# Patient Record
Sex: Female | Born: 1995 | Race: White | Hispanic: No | Marital: Single | Smoking: Current every day smoker
Health system: Southern US, Community
[De-identification: ages and names within clinical notes are randomized; demographics above are authoritative.]

## PROBLEM LIST (undated history)

## (undated) ENCOUNTER — Inpatient Hospital Stay (HOSPITAL_COMMUNITY): Payer: Self-pay

## (undated) DIAGNOSIS — M51369 Other intervertebral disc degeneration, lumbar region without mention of lumbar back pain or lower extremity pain: Secondary | ICD-10-CM

## (undated) DIAGNOSIS — M5136 Other intervertebral disc degeneration, lumbar region: Secondary | ICD-10-CM

## (undated) DIAGNOSIS — Z789 Other specified health status: Secondary | ICD-10-CM

## (undated) HISTORY — PX: HEMANGIOMA EXCISION: SHX1734

## (undated) HISTORY — PX: WISDOM TOOTH EXTRACTION: SHX21

---
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
## 2013-04-06 ENCOUNTER — Encounter (HOSPITAL_BASED_OUTPATIENT_CLINIC_OR_DEPARTMENT_OTHER): Payer: Self-pay | Admitting: Emergency Medicine

## 2013-04-06 ENCOUNTER — Emergency Department (HOSPITAL_BASED_OUTPATIENT_CLINIC_OR_DEPARTMENT_OTHER)
Admission: EM | Admit: 2013-04-06 | Discharge: 2013-04-06 | Disposition: A | Discharge status: Home / Self Care | Payer: Medicaid Other | Attending: Emergency Medicine | Admitting: Emergency Medicine

## 2013-04-06 DIAGNOSIS — F17200 Nicotine dependence, unspecified, uncomplicated: Secondary | ICD-10-CM | POA: Insufficient documentation

## 2013-04-06 DIAGNOSIS — B86 Scabies: Secondary | ICD-10-CM

## 2013-04-06 MED ORDER — PERMETHRIN 5 % EX CREA: TOPICAL_CREAM | CUTANEOUS | Status: DC

## 2013-04-06 NOTE — ED Notes (Signed)
Pt reports scabies on arms, legs and abdomen.

## 2013-04-06 NOTE — ED Provider Notes (Signed)
Medical screening examination/treatment/procedure(s) were performed by non-physician practitioner and as supervising physician I was immediately available for consultation/collaboration.   Charles B. Sheldon, MD 04/06/13 2230 

## 2013-04-06 NOTE — ED Provider Notes (Signed)
CSN: [id number]     Arrival date & time 04/06/13  1901 History   First MD Initiated Contact with Patient 04/06/13 1913     Chief Complaint  Patient presents with  . Recurrent Skin Infections   (Consider location/radiation/quality/duration/timing/severity/associated sxs/prior Treatment) HPI Comments: Patient is a 17 year old female who presents with a 3 day history of rash. The rash started gradually and progressively worsened since the onset. The rash is located on her arms, legs and abdomen. Patient has tried calamine lotion without relief. Patient denies new exposures to medications, soaps, lotions, detergent. Patient reports associated itching. Patient's son was diagnosed with scabies after another child in daycare exposed him to it. No aggravating/alleviating factors. Patient denies fever, chills, NVD, sore throat, oral lesions, ocular involvement, throat closing, wheezing, SOB, chest pain, abdominal pain.      Past Medical History  Diagnosis Date  . No pertinent past medical history   . Normal pregnancy 07/04/2011  . SVD (spontaneous vaginal delivery) 07/05/2011   Past Surgical History  Procedure Laterality Date  . Wisdom tooth extraction    . Hemangioma excision     History reviewed. No pertinent family history. History  Substance Use Topics  . Smoking status: Current Every Day Smoker -- 0.50 packs/day    Types: Cigarettes  . Smokeless tobacco: Not on file  . Alcohol Use: Yes   OB History   Grav Para Term Preterm Abortions TAB SAB Ect Mult Living   1 1 1       1      Review of Systems  Skin: Positive for rash.  All other systems reviewed and are negative.    Allergies  Review of patient's allergies indicates no known allergies.  Home Medications  No current outpatient prescriptions on file. BP 105/59  Pulse 73  Temp(Src) 98.4 F (36.9 C) (Oral)  Resp 18  Ht 5\' 4"  (1.626 m)  Wt 120 lb (54.432 kg)  BMI 20.59 kg/m2  SpO2 99% Physical Exam  Nursing note and  vitals reviewed. Constitutional: She appears well-developed and well-nourished. No distress.  HENT:  Head: Normocephalic and atraumatic.  Eyes: Conjunctivae are normal.  Neck: Normal range of motion.  Cardiovascular: Normal rate and regular rhythm.  Exam reveals no gallop and no friction rub.   No murmur heard. Pulmonary/Chest: Effort normal and breath sounds normal. She has no wheezes. She has no rales. She exhibits no tenderness.  Abdominal: Soft. There is no tenderness.  Musculoskeletal: Normal range of motion.  Neurological: She is alert.  Speech is goal-oriented. Moves limbs without ataxia.   Skin: Skin is warm and dry.  Small scabbed papules located on bilateral arms, legs and abdomen with overlying excoriations.   Psychiatric: She has a normal mood and affect. Her behavior is normal.    ED Course  Procedures (including critical care time) Labs Review Labs Reviewed - No data to display Imaging Review No results found.  EKG Interpretation   None       MDM   1. Scabies     7:16 PM Patient has scabies. I will treat her with Permethrin cream. No further evaluation needed at this time. Vitals stable and patient afebrile.    Emilia Beck, PA-C 04/06/13 1921

## 2013-11-20 ENCOUNTER — Emergency Department (HOSPITAL_BASED_OUTPATIENT_CLINIC_OR_DEPARTMENT_OTHER)
Admission: EM | Admit: 2013-11-20 | Discharge: 2013-11-20 | Disposition: A | Discharge status: Home / Self Care | Payer: Medicaid Other | Attending: Emergency Medicine | Admitting: Emergency Medicine

## 2013-11-20 ENCOUNTER — Encounter (HOSPITAL_BASED_OUTPATIENT_CLINIC_OR_DEPARTMENT_OTHER): Payer: Self-pay | Admitting: Emergency Medicine

## 2013-11-20 DIAGNOSIS — Z79899 Other long term (current) drug therapy: Secondary | ICD-10-CM | POA: Insufficient documentation

## 2013-11-20 DIAGNOSIS — Z3202 Encounter for pregnancy test, result negative: Secondary | ICD-10-CM | POA: Insufficient documentation

## 2013-11-20 DIAGNOSIS — F17200 Nicotine dependence, unspecified, uncomplicated: Secondary | ICD-10-CM | POA: Insufficient documentation

## 2013-11-20 DIAGNOSIS — N390 Urinary tract infection, site not specified: Secondary | ICD-10-CM

## 2013-11-20 LAB — URINALYSIS, ROUTINE W REFLEX MICROSCOPIC
Glucose, UA: NEGATIVE mg/dL
KETONES UR: 15 mg/dL — AB
NITRITE: NEGATIVE
PH: 5.5 (ref 5.0–8.0)
Protein, ur: 100 mg/dL — AB
SPECIFIC GRAVITY, URINE: 1.023 (ref 1.005–1.030)
UROBILINOGEN UA: 0.2 mg/dL (ref 0.0–1.0)

## 2013-11-20 LAB — CBC WITH DIFFERENTIAL/PLATELET
Basophils Absolute: 0 10*3/uL (ref 0.0–0.1)
Basophils Relative: 0 % (ref 0–1)
EOS ABS: 0.2 10*3/uL (ref 0.0–0.7)
EOS PCT: 2 % (ref 0–5)
HEMATOCRIT: 40.6 % (ref 36.0–46.0)
HEMOGLOBIN: 14.5 g/dL (ref 12.0–15.0)
Lymphocytes Relative: 16 % (ref 12–46)
Lymphs Abs: 1.8 10*3/uL (ref 0.7–4.0)
MCH: 32.3 pg (ref 26.0–34.0)
MCHC: 35.7 g/dL (ref 30.0–36.0)
MCV: 90.4 fL (ref 78.0–100.0)
MONO ABS: 0.9 10*3/uL (ref 0.1–1.0)
MONOS PCT: 8 % (ref 3–12)
Neutro Abs: 8.4 10*3/uL — ABNORMAL HIGH (ref 1.7–7.7)
Neutrophils Relative %: 74 % (ref 43–77)
Platelets: 266 10*3/uL (ref 150–400)
RBC: 4.49 MIL/uL (ref 3.87–5.11)
RDW: 12.5 % (ref 11.5–15.5)
WBC: 11.2 10*3/uL — ABNORMAL HIGH (ref 4.0–10.5)

## 2013-11-20 LAB — BASIC METABOLIC PANEL
BUN: 10 mg/dL (ref 6–23)
CO2: 24 mEq/L (ref 19–32)
Calcium: 9.6 mg/dL (ref 8.4–10.5)
Chloride: 103 mEq/L (ref 96–112)
Creatinine, Ser: 0.7 mg/dL (ref 0.50–1.10)
GFR calc Af Amer: >90 mL/min (ref >90)
GLUCOSE: 101 mg/dL — AB (ref 70–99)
Potassium: 3.8 mEq/L (ref 3.7–5.3)
Sodium: 140 mEq/L (ref 137–147)

## 2013-11-20 LAB — URINE MICROSCOPIC-ADD ON

## 2013-11-20 LAB — PREGNANCY, URINE: Preg Test, Ur: NEGATIVE

## 2013-11-20 MED ORDER — ONDANSETRON HCL 4 MG/2ML IJ SOLN
4.0000 mg | Freq: Once | INTRAMUSCULAR | Status: AC
  Administered 2013-11-20: 4 mg via INTRAVENOUS
  Filled 2013-11-20: qty 2

## 2013-11-20 MED ORDER — SODIUM CHLORIDE 0.9 % IV BOLUS (SEPSIS)
1000.0000 mL | Freq: Once | INTRAVENOUS | Status: AC
  Administered 2013-11-20: 1000 mL via INTRAVENOUS

## 2013-11-20 MED ORDER — CEPHALEXIN 500 MG PO CAPS
500.0000 mg | ORAL_CAPSULE | Freq: Two times a day (BID) | ORAL | Status: DC
Start: 2013-11-20 — End: 2014-03-17

## 2013-11-20 MED ORDER — KETOROLAC TROMETHAMINE 30 MG/ML IJ SOLN
30.0000 mg | Freq: Once | INTRAMUSCULAR | Status: AC
  Administered 2013-11-20: 30 mg via INTRAVENOUS
  Filled 2013-11-20: qty 1

## 2013-11-20 MED ORDER — KETOROLAC TROMETHAMINE 10 MG PO TABS: 10.0000 mg | ORAL_TABLET | Freq: Four times a day (QID) | ORAL | Status: DC | PRN

## 2013-11-20 MED ORDER — CEFTRIAXONE SODIUM 1 G IJ SOLR
INTRAMUSCULAR | Status: AC
  Filled 2013-11-20: qty 10

## 2013-11-20 MED ORDER — DEXTROSE 5 % IV SOLN
1.0000 g | Freq: Once | INTRAVENOUS | Status: AC
  Administered 2013-11-20: 1 g via INTRAVENOUS

## 2013-11-20 NOTE — Discharge Instructions (Signed)
Urinary Tract Infection  Urinary tract infections (UTIs) can develop anywhere along your urinary tract. Your urinary tract is your body's drainage system for removing wastes and extra water. Your urinary tract includes two kidneys, two ureters, a bladder, and a urethra. Your kidneys are a pair of bean-shaped organs. Each kidney is about the size of your fist. They are located below your ribs, one on each side of your spine.  CAUSES  Infections are caused by microbes, which are microscopic organisms, including fungi, viruses, and bacteria. These organisms are so small that they can only be seen through a microscope. Bacteria are the microbes that most commonly cause UTIs.  SYMPTOMS   Symptoms of UTIs may vary by age and gender of the patient and by the location of the infection. Symptoms in young women typically include a frequent and intense urge to urinate and a painful, burning feeling in the bladder or urethra during urination. Older women and men are more likely to be tired, shaky, and weak and have muscle aches and abdominal pain. A fever may mean the infection is in your kidneys. Other symptoms of a kidney infection include pain in your back or sides below the ribs, nausea, and vomiting.  DIAGNOSIS  To diagnose a UTI, your caregiver will ask you about your symptoms. Your caregiver also will ask to provide a urine sample. The urine sample will be tested for bacteria and white blood cells. White blood cells are made by your body to help fight infection.  TREATMENT   Typically, UTIs can be treated with medication. Because most UTIs are caused by a bacterial infection, they usually can be treated with the use of antibiotics. The choice of antibiotic and length of treatment depend on your symptoms and the type of bacteria causing your infection.  HOME CARE INSTRUCTIONS   If you were prescribed antibiotics, take them exactly as your caregiver instructs you. Finish the medication even if you feel better after you  have only taken some of the medication.   Drink enough water and fluids to keep your urine clear or pale yellow.   Avoid caffeine, tea, and carbonated beverages. They tend to irritate your bladder.   Empty your bladder often. Avoid holding urine for long periods of time.   Empty your bladder before and after sexual intercourse.   After a bowel movement, women should cleanse from front to back. Use each tissue only once.  SEEK MEDICAL CARE IF:    You have back pain.   You develop a fever.   Your symptoms do not begin to resolve within 3 days.  SEEK IMMEDIATE MEDICAL CARE IF:    You have severe back pain or lower abdominal pain.   You develop chills.   You have nausea or vomiting.   You have continued burning or discomfort with urination.  MAKE SURE YOU:    Understand these instructions.   Will watch your condition.   Will get help right away if you are not doing well or get worse.  Document Released: 03/19/2005 Document Revised: 12/09/2011 Document Reviewed: 07/18/2011  [employer] Patient Information 2014 [employer].

## 2013-11-20 NOTE — ED Notes (Signed)
Blood in urine and "everything hurts"

## 2013-11-20 NOTE — ED Provider Notes (Signed)
TIME SEEN: 11:14 AM  CHIEF COMPLAINT: Dysuria, hematuria, body aches, suprapubic pain  HPI: Patient is a 18 year old female who presents to the emergency department with dysuria that started last night and hematuria. She has had body aches, nausea,  suprapubic abdominal pain. She denies any vaginal bleeding or discharge. No fevers or chills. No vomiting or diarrhea. No vaginal discharge. Her last menstrual period was 2 weeks ago. She states this feels like urinary tract infections and she has had the past but is more severe.  ROS: See HPI Constitutional: no fever  Eyes: no drainage  ENT: no runny nose   Cardiovascular:  no chest pain  Resp: no SOB  GI: no vomiting GU: dysuria Integumentary: no rash  Allergy: no hives  Musculoskeletal: no leg swelling  Neurological: no slurred speech ROS otherwise negative  PAST MEDICAL HISTORY/PAST SURGICAL HISTORY:  Past Medical History  Diagnosis Date  . No pertinent past medical history   . Normal pregnancy 07/04/2011  . SVD (spontaneous vaginal delivery) 07/05/2011    MEDICATIONS:  Prior to Admission medications   Medication Sig Start Date End Date Taking? Authorizing Provider  permethrin (ELIMITE) 5 % cream Apply to affected area once 04/06/13   Emilia Beck, PA-C    ALLERGIES:  No Known Allergies  SOCIAL HISTORY:  History  Substance Use Topics  . Smoking status: Current Every Day Smoker -- 0.50 packs/day    Types: Cigarettes  . Smokeless tobacco: Not on file  . Alcohol Use: No    FAMILY HISTORY: No family history on file.  EXAM: BP 128/77  Pulse 91  Temp(Src) 98.1 F (36.7 C) (Oral)  Resp 18  Ht 5\' 4"  (1.626 m)  Wt 115 lb (52.164 kg)  BMI 19.73 kg/m2  SpO2 100% CONSTITUTIONAL: Alert and oriented and responds appropriately to questions. Well-appearing; well-nourished, nontoxic but does appear uncomfortable HEAD: Normocephalic EYES: Conjunctivae clear, PERRL ENT: normal nose; no rhinorrhea; moist mucous membranes;  pharynx without lesions noted NECK: Supple, no meningismus, no LAD  CARD: RRR; S1 and S2 appreciated; no murmurs, no clicks, no rubs, no gallops RESP: Normal chest excursion without splinting or tachypnea; breath sounds clear and equal bilaterally; no wheezes, no rhonchi, no rales,  ABD/GI: Normal bowel sounds; non-distended; soft, mild suprapubic tenderness on exam, no rebound or guarding, no peritoneal signs BACK:  The back appears normal and is non-tender to palpation, there is no CVA tenderness EXT: Normal ROM in all joints; non-tender to palpation; no edema; normal capillary refill; no cyanosis    SKIN: Normal color for age and race; warm NEURO: Moves all extremities equally PSYCH: The patient's mood and manner are appropriate. Grooming and personal hygiene are appropriate.  MEDICAL DECISION MAKING: Patient here with likely UTI. Will obtain labs, urine, urine pregnancy test. We'll give IV fluids, Zofran and Toradol. I do not feel she needs imaging at this time given her abdominal exam is relatively benign.  ED PROGRESS: Patient does have urinary tract infection. Culture is pending. Urine pregnancy negative. Fiber is unremarkable. She states she's feeling much better after IV fluids, Zofran and Toradol. We'll give IV ceftriaxone. We'll discharge with prescription for Keflex and oral Toradol. Discussed strict return precautions and supportive care instructions. She verbalizes understanding is comfortable plan. She is pleased with care.     Layla Maw Ward, DO 11/20/13 [phone]

## 2013-11-22 LAB — URINE CULTURE: Colony Count: >=100000

## 2013-11-24 ENCOUNTER — Telehealth (HOSPITAL_BASED_OUTPATIENT_CLINIC_OR_DEPARTMENT_OTHER): Payer: Self-pay | Admitting: Emergency Medicine

## 2013-11-24 NOTE — Telephone Encounter (Signed)
Post ED Visit - Positive Culture Follow-up  Culture report reviewed by antimicrobial stewardship pharmacist: []  Marlou Sa, Pharm.D., BCPS []  Celedonio Miyamoto, Pharm.D., BCPS []  Georgina Pillion, Pharm.D., BCPS [x]  [place], [place].D., BCPS, AAHIVP []  Estella Husk, Pharm.D., BCPS, AAHIVP []  Harvie Junior, Pharm.D.  Positive urine culture Treated with Keflex, organism sensitive to the same and no further patient follow-up is required at this time.  Xavius Spadafore 11/24/2013, 1:41 PM

## 2013-12-02 ENCOUNTER — Encounter (HOSPITAL_BASED_OUTPATIENT_CLINIC_OR_DEPARTMENT_OTHER): Payer: Self-pay | Admitting: Emergency Medicine

## 2013-12-02 DIAGNOSIS — Z792 Long term (current) use of antibiotics: Secondary | ICD-10-CM | POA: Insufficient documentation

## 2013-12-02 DIAGNOSIS — J029 Acute pharyngitis, unspecified: Secondary | ICD-10-CM | POA: Insufficient documentation

## 2013-12-02 DIAGNOSIS — F17200 Nicotine dependence, unspecified, uncomplicated: Secondary | ICD-10-CM | POA: Insufficient documentation

## 2013-12-02 LAB — RAPID STREP SCREEN (MED CTR MEBANE ONLY): Streptococcus, Group A Screen (Direct): NEGATIVE

## 2013-12-02 NOTE — ED Notes (Signed)
Pt c/o sore throat x2 days 

## 2013-12-03 ENCOUNTER — Emergency Department (HOSPITAL_BASED_OUTPATIENT_CLINIC_OR_DEPARTMENT_OTHER)
Admission: EM | Admit: 2013-12-03 | Discharge: 2013-12-03 | Disposition: A | Discharge status: Home / Self Care | Payer: Medicaid Other | Attending: Emergency Medicine | Admitting: Emergency Medicine

## 2013-12-03 DIAGNOSIS — J029 Acute pharyngitis, unspecified: Secondary | ICD-10-CM

## 2013-12-03 LAB — MONONUCLEOSIS SCREEN: MONO SCREEN: NEGATIVE

## 2013-12-03 MED ORDER — AMOXICILLIN 500 MG PO CAPS: 500.0000 mg | ORAL_CAPSULE | Freq: Three times a day (TID) | ORAL | Status: DC

## 2013-12-03 NOTE — ED Provider Notes (Signed)
CSN: [id number]     Arrival date & time 12/02/13  2135 History   First MD Initiated Contact with Patient 12/03/13 0023     Chief Complaint  Patient presents with  . Sore Throat     (Consider location/radiation/quality/duration/timing/severity/associated sxs/prior Treatment) Patient is a 18 y.o. female presenting with pharyngitis. The history is provided by the patient.  Sore Throat This is a new problem. The current episode started more than 2 days ago. The problem occurs constantly. The problem has not changed since onset.Pertinent negatives include no chest pain, no abdominal pain, no headaches and no shortness of breath. Nothing aggravates the symptoms. Nothing relieves the symptoms. She has tried nothing for the symptoms. The treatment provided no relief.    Past Medical History  Diagnosis Date  . No pertinent past medical history   . Normal pregnancy 07/04/2011  . SVD (spontaneous vaginal delivery) 07/05/2011   Past Surgical History  Procedure Laterality Date  . Wisdom tooth extraction    . Hemangioma excision     History reviewed. No pertinent family history. History  Substance Use Topics  . Smoking status: Current Every Day Smoker -- 0.50 packs/day    Types: Cigarettes  . Smokeless tobacco: Not on file  . Alcohol Use: No   OB History   Grav Para Term Preterm Abortions TAB SAB Ect Mult Living   1 1 1       1      Review of Systems  Constitutional: Negative for fever.  Respiratory: Negative for shortness of breath.   Cardiovascular: Negative for chest pain.  Gastrointestinal: Negative for abdominal pain.  Neurological: Negative for headaches.  All other systems reviewed and are negative.     Allergies  Review of patient's allergies indicates no known allergies.  Home Medications   Prior to Admission medications   Medication Sig Start Date End Date Taking? Authorizing Provider  cephALEXin (KEFLEX) 500 MG capsule Take 1 capsule (500 mg total) by mouth 2 (two)  times daily. 11/20/13   Kristen N Ward, DO  ketorolac (TORADOL) 10 MG tablet Take 1 tablet (10 mg total) by mouth every 6 (six) hours as needed for moderate pain. 11/20/13   Kristen N Ward, DO  permethrin (ELIMITE) 5 % cream Apply to affected area once 04/06/13   Kaitlyn Szekalski, PA-C   BP 105/72  Pulse 99  Temp(Src) 99.3 F (37.4 C) (Oral)  Resp 16  Ht 5' (1.524 m)  Wt 115 lb (52.164 kg)  BMI 22.46 kg/m2  SpO2 100% Physical Exam  Constitutional: She is oriented to person, place, and time. She appears well-developed and well-nourished. No distress.  HENT:  Head: Normocephalic and atraumatic.  Mouth/Throat: Oropharynx is clear and moist.  White plaque on left tonsil  Eyes: Conjunctivae are normal. Pupils are equal, round, and reactive to light.  Neck: Normal range of motion. Neck supple. No tracheal deviation present.  No pain with displacement of the trachea  Cardiovascular: Normal rate, regular rhythm and intact distal pulses.   Pulmonary/Chest: Effort normal and breath sounds normal. She has no wheezes. She has no rales.  Abdominal: Soft. Bowel sounds are normal. There is no tenderness. There is no rebound and no guarding.  Musculoskeletal: Normal range of motion.  Lymphadenopathy:    She has no cervical adenopathy.  Neurological: She is alert and oriented to person, place, and time.  Skin: Skin is warm and dry.  Psychiatric: She has a normal mood and affect.    ED Course  Procedures (  including critical care time) Labs Review Labs Reviewed  RAPID STREP SCREEN  CULTURE, GROUP A STREP  MONONUCLEOSIS SCREEN    Imaging Review No results found.   EKG Interpretation None      MDM   Final diagnoses:  None    Exam suspicion for strep will treat    Angela Kessenich K Cesare Sumlin-Rasch, MD 12/03/13 0117

## 2013-12-03 NOTE — ED Notes (Signed)
Came to room to discharge pt and she was angry and crying.  When I told her about her rx for Amoxicillin she stated that she had just gotten off Amoxicillin for a bladder infection.  York SpanielSaid she has "Sat here for 5 hours for nothing.  I would be better off at home in my bed.  That ain't gonna do nothing for me."  Pt grabbed the DC papers and rx and walked out of the room.  Could not get DC signature.

## 2013-12-04 ENCOUNTER — Emergency Department (HOSPITAL_COMMUNITY)
Admission: EM | Admit: 2013-12-04 | Discharge: 2013-12-04 | Disposition: A | Discharge status: Home / Self Care | Payer: Medicaid Other | Attending: Emergency Medicine | Admitting: Emergency Medicine

## 2013-12-04 ENCOUNTER — Encounter (HOSPITAL_COMMUNITY): Payer: Self-pay | Admitting: Emergency Medicine

## 2013-12-04 DIAGNOSIS — Z792 Long term (current) use of antibiotics: Secondary | ICD-10-CM | POA: Insufficient documentation

## 2013-12-04 DIAGNOSIS — F17200 Nicotine dependence, unspecified, uncomplicated: Secondary | ICD-10-CM | POA: Insufficient documentation

## 2013-12-04 DIAGNOSIS — J029 Acute pharyngitis, unspecified: Secondary | ICD-10-CM | POA: Insufficient documentation

## 2013-12-04 LAB — CULTURE, GROUP A STREP

## 2013-12-04 MED ORDER — DEXAMETHASONE SODIUM PHOSPHATE 10 MG/ML IJ SOLN
10.0000 mg | Freq: Once | INTRAMUSCULAR | Status: AC
  Administered 2013-12-04: 10 mg via INTRAMUSCULAR
  Filled 2013-12-04: qty 1

## 2013-12-04 MED ORDER — HYDROCODONE-ACETAMINOPHEN 5-325 MG PO TABS: 1.0000 | ORAL_TABLET | ORAL | Status: DC | PRN

## 2013-12-04 NOTE — ED Provider Notes (Signed)
CSN: [id number]     Arrival date & time 12/04/13  1300 History  This chart was scribed for non-physician practitioner, Teressa LowerVrinda Tayte Childers, NP working with Lyanne CoKevin M Campos, MD by Greggory StallionKayla Andersen, ED scribe. This patient was seen in room WTR8/WTR8 and the patient's care was started at 1:47 PM.    Chief Complaint  Patient presents with  . Sore Throat   The history is provided by the patient. No language interpreter was used.   HPI Comments: Angela Orozco is a 18 y.o. female who presents to the Emergency Department complaining of gradual onset sore throat that started 3 days ago. [facility] where strep and mono tests were both negative. She was discharged home with amoxicillin and has been taking it as prescribed. Swallowing worsens the pain. Pt also reports congestion and intermittent fever. Denies cough.   Past Medical History  Diagnosis Date  . No pertinent past medical history   . Normal pregnancy 07/04/2011  . SVD (spontaneous vaginal delivery) 07/05/2011   Past Surgical History  Procedure Laterality Date  . Wisdom tooth extraction    . Hemangioma excision     History reviewed. No pertinent family history. History  Substance Use Topics  . Smoking status: Current Every Day Smoker -- 0.50 packs/day    Types: Cigarettes  . Smokeless tobacco: Not on file  . Alcohol Use: No   OB History   Grav Para Term Preterm Abortions TAB SAB Ect Mult Living   1 1 1       1      Review of Systems  Constitutional: Positive for fever.  HENT: Positive for congestion and sore throat.   Respiratory: Negative for cough.   All other systems reviewed and are negative.  Allergies  Review of patient's allergies indicates no known allergies.  Home Medications   Prior to Admission medications   Medication Sig Start Date End Date Taking? Authorizing Provider  amoxicillin (AMOXIL) 500 MG capsule Take 1 capsule (500 mg total) by mouth 3 (three) times daily.  12/03/13   April K Palumbo-Rasch, MD  cephALEXin (KEFLEX) 500 MG capsule Take 1 capsule (500 mg total) by mouth 2 (two) times daily. 11/20/13   Kristen N Ward, DO  ketorolac (TORADOL) 10 MG tablet Take 1 tablet (10 mg total) by mouth every 6 (six) hours as needed for moderate pain. 11/20/13   Kristen N Ward, DO  permethrin (ELIMITE) 5 % cream Apply to affected area once 04/06/13   Kaitlyn Szekalski, PA-C   BP 114/69  Pulse 103  Temp(Src) 98.7 F (37.1 C) (Oral)  Resp 16  SpO2 97%  Physical Exam  Nursing note and vitals reviewed. Constitutional: She is oriented to person, place, and time. She appears well-developed and well-nourished. No distress.  HENT:  Head: Normocephalic and atraumatic.  Ulcer on right tonsils. No sign of peritonsillar abscess.   Eyes: Conjunctivae and EOM are normal.  Neck: Normal range of motion. No tracheal deviation present.  Cardiovascular: Normal rate, regular rhythm and normal heart sounds.   Pulmonary/Chest: Effort normal and breath sounds normal. No respiratory distress. She has no wheezes. She has no rales.  Musculoskeletal: Normal range of motion.  Neurological: She is alert and oriented to person, place, and time.  Skin: Skin is warm and dry.  Psychiatric: She has a normal mood and affect. Her behavior is normal.    ED Course  Procedures (including critical care time)  DIAGNOSTIC STUDIES: Oxygen Saturation  is 97% on RA, normal by my interpretation.    COORDINATION OF CARE: 1:49 PM-Discussed treatment plan with pt at bedside and pt agreed to plan.   Labs Review Labs Reviewed - No data to display  Imaging Review No results found.   EKG Interpretation None      MDM   Final diagnoses:  Pharyngitis    Culture not back. Pt on amoxicillin. Pt treated with decadron and pain medication. No sign of pta  I personally performed the services described in this documentation, which was scribed in my presence. The recorded information has been  reviewed and is accurate.  Teressa LowerVrinda Va Broadwell, NP 12/04/13 1405

## 2013-12-04 NOTE — ED Provider Notes (Signed)
Medical screening examination/treatment/procedure(s) were performed by non-physician practitioner and as supervising physician I was immediately available for consultation/collaboration.   EKG Interpretation None        Ranvir Renovato M Finnlee Guarnieri, MD 12/04/13 1510 

## 2013-12-04 NOTE — Discharge Instructions (Signed)
Pharyngitis °Pharyngitis is redness, pain, and swelling (inflammation) of your pharynx.  °CAUSES  °Pharyngitis is usually caused by infection. Most of the time, these infections are from viruses (viral) and are part of a cold. However, sometimes pharyngitis is caused by bacteria (bacterial). Pharyngitis can also be caused by allergies. Viral pharyngitis may be spread from person to person by coughing, sneezing, and personal items or utensils (cups, forks, spoons, toothbrushes). Bacterial pharyngitis may be spread from person to person by more intimate contact, such as kissing.  °SIGNS AND SYMPTOMS  °Symptoms of pharyngitis include:   °· Sore throat.   °· Tiredness (fatigue).   °· Low-grade fever.   °· Headache. °· Joint pain and muscle aches. °· Skin rashes. °· Swollen lymph nodes. °· Plaque-like film on throat or tonsils (often seen with bacterial pharyngitis). °DIAGNOSIS  °Your health care provider will ask you questions about your illness and your symptoms. Your medical history, along with a physical exam, is often all that is needed to diagnose pharyngitis. Sometimes, a rapid strep test is done. Other lab tests may also be done, depending on the suspected cause.  °TREATMENT  °Viral pharyngitis will usually get better in 3 4 days without the use of medicine. Bacterial pharyngitis is treated with medicines that kill germs (antibiotics).  °HOME CARE INSTRUCTIONS  °· Drink enough water and fluids to keep your urine clear or pale yellow.   °· Only take over-the-counter or prescription medicines as directed by your health care provider:   °· If you are prescribed antibiotics, make sure you finish them even if you start to feel better.   °· Do not take aspirin.   °· Get lots of rest.   °· Gargle with 8 oz of salt water (½ tsp of salt per 1 qt of water) as often as every 1 2 hours to soothe your throat.   °· Throat lozenges (if you are not at risk for choking) or sprays may be used to soothe your throat. °SEEK MEDICAL  CARE IF:  °· You have large, tender lumps in your neck. °· You have a rash. °· You cough up green, yellow-brown, or bloody spit. °SEEK IMMEDIATE MEDICAL CARE IF:  °· Your neck becomes stiff. °· You drool or are unable to swallow liquids. °· You vomit or are unable to keep medicines or liquids down. °· You have severe pain that does not go away with the use of recommended medicines. °· You have trouble breathing (not caused by a stuffy nose). °MAKE SURE YOU:  °· Understand these instructions. °· Will watch your condition. °· Will get help right away if you are not doing well or get worse. °Document Released: 06/09/2005 Document Revised: 03/30/2013 Document Reviewed: 02/14/2013 °[employer]® Patient Information ©2014 [employer]. ° °

## 2013-12-04 NOTE — ED Notes (Signed)
[facility] for same complaint. Sore throat pain 8/10. Pain when swallowing. Pt was tested and came back negative for strep and mono. Prescribe amoxicillin and has been taking as prescribed.

## 2014-01-13 ENCOUNTER — Emergency Department (HOSPITAL_BASED_OUTPATIENT_CLINIC_OR_DEPARTMENT_OTHER)
Admission: EM | Admit: 2014-01-13 | Discharge: 2014-01-13 | Disposition: A | Discharge status: Home / Self Care | Payer: Medicaid Other | Attending: Emergency Medicine | Admitting: Emergency Medicine

## 2014-01-13 ENCOUNTER — Encounter (HOSPITAL_BASED_OUTPATIENT_CLINIC_OR_DEPARTMENT_OTHER): Payer: Self-pay | Admitting: Emergency Medicine

## 2014-01-13 DIAGNOSIS — Z792 Long term (current) use of antibiotics: Secondary | ICD-10-CM | POA: Insufficient documentation

## 2014-01-13 DIAGNOSIS — M791 Myalgia, unspecified site: Secondary | ICD-10-CM

## 2014-01-13 DIAGNOSIS — T148 Other injury of unspecified body region: Secondary | ICD-10-CM | POA: Insufficient documentation

## 2014-01-13 DIAGNOSIS — IMO0001 Reserved for inherently not codable concepts without codable children: Secondary | ICD-10-CM | POA: Insufficient documentation

## 2014-01-13 DIAGNOSIS — R21 Rash and other nonspecific skin eruption: Secondary | ICD-10-CM | POA: Insufficient documentation

## 2014-01-13 DIAGNOSIS — IMO0002 Reserved for concepts with insufficient information to code with codable children: Secondary | ICD-10-CM | POA: Insufficient documentation

## 2014-01-13 DIAGNOSIS — W57XXXA Bitten or stung by nonvenomous insect and other nonvenomous arthropods, initial encounter: Secondary | ICD-10-CM | POA: Insufficient documentation

## 2014-01-13 DIAGNOSIS — Y929 Unspecified place or not applicable: Secondary | ICD-10-CM | POA: Insufficient documentation

## 2014-01-13 DIAGNOSIS — F17200 Nicotine dependence, unspecified, uncomplicated: Secondary | ICD-10-CM | POA: Insufficient documentation

## 2014-01-13 DIAGNOSIS — Y9389 Activity, other specified: Secondary | ICD-10-CM | POA: Insufficient documentation

## 2014-01-13 LAB — CBC WITH DIFFERENTIAL/PLATELET
BASOS PCT: 0 % (ref 0–1)
Basophils Absolute: 0 10*3/uL (ref 0.0–0.1)
EOS ABS: 0.2 10*3/uL (ref 0.0–0.7)
EOS PCT: 3 % (ref 0–5)
HEMATOCRIT: 41.6 % (ref 36.0–46.0)
Hemoglobin: 14.3 g/dL (ref 12.0–15.0)
Lymphocytes Relative: 19 % (ref 12–46)
Lymphs Abs: 1.3 10*3/uL (ref 0.7–4.0)
MCH: 31 pg (ref 26.0–34.0)
MCHC: 34.4 g/dL (ref 30.0–36.0)
MCV: 90 fL (ref 78.0–100.0)
MONO ABS: 0.8 10*3/uL (ref 0.1–1.0)
Monocytes Relative: 11 % (ref 3–12)
Neutro Abs: 4.9 10*3/uL (ref 1.7–7.7)
Neutrophils Relative %: 68 % (ref 43–77)
Platelets: 248 10*3/uL (ref 150–400)
RBC: 4.62 MIL/uL (ref 3.87–5.11)
RDW: 13.5 % (ref 11.5–15.5)
WBC: 7.2 10*3/uL (ref 4.0–10.5)

## 2014-01-13 MED ORDER — PREDNISONE 20 MG PO TABS: 40.0000 mg | ORAL_TABLET | Freq: Every day | ORAL | Status: DC

## 2014-01-13 MED ORDER — HYDROXYZINE HCL 25 MG PO TABS: 25.0000 mg | ORAL_TABLET | Freq: Four times a day (QID) | ORAL | Status: DC

## 2014-01-13 MED ORDER — HYDROXYZINE HCL 25 MG PO TABS
25.0000 mg | ORAL_TABLET | Freq: Once | ORAL | Status: AC
  Administered 2014-01-13: 25 mg via ORAL
  Filled 2014-01-13: qty 1

## 2014-01-13 MED ORDER — PREDNISONE 50 MG PO TABS
60.0000 mg | ORAL_TABLET | Freq: Once | ORAL | Status: AC
  Administered 2014-01-13: 60 mg via ORAL
  Filled 2014-01-13 (×2): qty 1

## 2014-01-13 NOTE — Discharge Instructions (Signed)
1 to 2 tablets of 25 mg Benadryl pills every 4-6 hours as needed to a maximum of 300 mg per day. In addition, you may apply a topical hydrocortisone ointment to all affected areas except for the face.   If you see signs of infection (warmth, redness, tenderness, pus, sharp increase in pain, fever, red streaking) immediately return to the emergency department.  Please follow with your primary care doctor in the next 2 days for a check-up. They must obtain records for further management.   Do not hesitate to return to the Emergency Department for any new, worsening or concerning symptoms.    Bedbugs Bedbugs are tiny bugs that live in and around beds. During the day, they hide in mattresses and other places near beds. They come out at night and bite people lying in bed. They need blood to live and grow. Bedbugs can be found in beds anywhere. Usually, they are found in places where many people come and go (hotels, shelters, hospitals). It does not matter whether the place is dirty or clean. Getting bitten by bedbugs rarely causes a medical problem. The biggest problem can be getting rid of them. This often takes the work of a Oncologistpest control expert. CAUSES  Less use of pesticides. Bedbugs were common before the 1950s. Then, strong pesticides such as DDT nearly wiped them out. Today, these pesticides are not used because they harm the environment and can cause health problems.  More travel. Besides mattresses, bedbugs can also live in clothing and luggage. They can come along as people travel from place to place. Bedbugs are more common in certain parts of the world. When people travel to those areas, the bugs can come home with them.  Presence of birds and bats. Bedbugs often infest birds and bats. If you have these animals in or near your home, bedbugs may infest your house, too. SYMPTOMS It does not hurt to be bitten by a bedbug. You will probably not wake up when you are bitten. Bedbugs usually bite  areas of the skin that are not covered. Symptoms may show when you wake up, or they may take a day or more to show up. Symptoms may include:  Small red bumps on the skin. These might be lined up in a row or clustered in a group.  A darker red dot in the middle of red bumps.  Blisters on the skin. There may be swelling and very bad itching. These may be signs of an allergic reaction. This does not happen often. DIAGNOSIS Bedbug bites might look and feel like other types of insect bites. The bugs do not stay on the body like ticks or lice. They bite, drop off, and crawl away to hide. Your caregiver will probably:  Ask about your symptoms.  Ask about your recent activities and travel.  Check your skin for bedbug bites.  Ask you to check at home for signs of bedbugs. You should look for:  Spots or stains on the bed or nearby. This could be from bedbugs that were crushed or from their eggs or waste.  Bedbugs themselves. They are reddish-brown, oval, and flat. They do not fly. They are about the size of an apple seed.  Places to look for bedbugs include:  Beds. Check mattresses, headboards, box springs, and bed frames.  On drapes and curtains near the bed.  Under carpeting in the bedroom.  Behind electrical outlets.  Behind any wallpaper that is peeling.  Inside luggage. TREATMENT Most bedbug  bites do not need treatment. They usually go away on their own in a few days. The bites are not dangerous. However, treatment may be needed if you have scratched so much that your skin has become infected. You may also need treatment if you are allergic to bedbug bites. Treatment options include:  A drug that stops swelling and itching (corticosteroid). Usually, a cream is rubbed on the skin. If you have a bad rash, you may be given a corticosteroid pill.  Oral antihistamines. These are pills to help control itching.  Antibiotic medicines. An antibiotic may be prescribed for infected  skin. HOME CARE INSTRUCTIONS   Take any medicine prescribed by your caregiver for your bites. Follow the directions carefully.  Consider wearing pajamas with long sleeves and pant legs.  Your bedroom may need to be treated. A pest control expert should make sure the bedbugs are gone. You may need to throw away mattresses or luggage. Ask the pest control expert what you can do to keep the bedbugs from coming back. Common suggestions include:  Putting a plastic cover over your mattress.  Washing and drying your clothes and bedding in hot water and a hot dryer. The temperature should be hotter than 120 F (48.9 C). Bedbugs are killed by high temperatures.  Vacuuming carefully all around your bed. Vacuum in all cracks and crevices where the bugs might hide. Do this often.  Carefully checking all used furniture, bedding, or clothes that you bring into your house.  Eliminating bird nests and bat roosts.  If you get bedbug bites when traveling, check all your possessions carefully before bringing them into your house. If you find any bugs on clothes or in your luggage, consider throwing those items away. SEEK MEDICAL CARE IF:  You have red bug bites that keep coming back.  You have red bug bites that itch badly.  You have bug bites that cause a skin rash.  You have scratch marks that are red and sore. SEEK IMMEDIATE MEDICAL CARE IF: You have a fever. Document Released: 07/12/2010 Document Revised: 09/01/2011 Document Reviewed: 07/12/2010 [facility] Patient Information 2015 [place], [place]. This information is not intended to replace advice given to you by your health care provider. Make sure you discuss any questions you have with your health care provider.

## 2014-01-13 NOTE — ED Provider Notes (Signed)
Medical screening examination/treatment/procedure(s) were performed by non-physician practitioner and as supervising physician I was immediately available for consultation/collaboration.   EKG Interpretation None        Candyce ChurnJohn David Cardin Nitschke III, MD 01/13/14 [phone]

## 2014-01-13 NOTE — ED Notes (Signed)
Pt sts woke up on Monday with bed bugs. Took everything out. 2 days later, developed rash to BUE/entire body. Also c/o pain/swelling to left forearm.

## 2014-01-13 NOTE — ED Provider Notes (Signed)
CSN: [id number]     Arrival date & time 01/13/14  1307 History   First MD Initiated Contact with Patient 01/13/14 1310     Chief Complaint  Patient presents with  . Rash  . Extremity Pain     (Consider location/radiation/quality/duration/timing/severity/associated sxs/prior Treatment) HPI  Kaleen OdeaBrianna L Lagerstrom is a 18 y.o. female complaining of pruritic rash onset 3 days ago. Patient's mother-in-law gave her a bedspread from [employer] and when she woke up there she noticed small bugs all over the bedspread. Itching started 2 days later in the areas that were exposed. Patient states that she burned the bedspread the rugs and several she could do was in the room. Patient also reports subjective fever, chills, general myalgia and fatigue. She denies any warmth or discharge from the rash, cough, runny nose, dysuria, hematuria, abnormal vaginal discharge, abdominal pain, headache, cervicalgia. On review of systems she endorses single episode of nonbilious, non-bloody, no coffee-ground emesis this a.m. States she is tolerating by mouth now. States she's been taking Benadryl home with little relief.  Past Medical History  Diagnosis Date  . No pertinent past medical history   . Normal pregnancy 07/04/2011  . SVD (spontaneous vaginal delivery) 07/05/2011   Past Surgical History  Procedure Laterality Date  . Wisdom tooth extraction    . Hemangioma excision     No family history on file. History  Substance Use Topics  . Smoking status: Current Every Day Smoker -- 0.50 packs/day    Types: Cigarettes  . Smokeless tobacco: Not on file  . Alcohol Use: No   OB History   Grav Para Term Preterm Abortions TAB SAB Ect Mult Living   1 1 1       1      Review of Systems  10 systems reviewed and found to be negative, except as noted in the HPI.   Allergies  Review of patient's allergies indicates no known allergies.  Home Medications   Prior to Admission medications   Medication Sig Start Date End  Date Taking? Authorizing Provider  amoxicillin (AMOXIL) 500 MG capsule Take 1 capsule (500 mg total) by mouth 3 (three) times daily. 12/03/13   April K Palumbo-Rasch, MD  cephALEXin (KEFLEX) 500 MG capsule Take 1 capsule (500 mg total) by mouth 2 (two) times daily. 11/20/13   Kristen N Ward, DO  HYDROcodone-acetaminophen (NORCO/VICODIN) 5-325 MG per tablet Take 1-2 tablets by mouth every 4 (four) hours as needed. 12/04/13   Teressa LowerVrinda Pickering, NP  hydrOXYzine (ATARAX/VISTARIL) 25 MG tablet Take 1 tablet (25 mg total) by mouth every 6 (six) hours. 01/13/14   Bethany Hirt, PA-C  ketorolac (TORADOL) 10 MG tablet Take 1 tablet (10 mg total) by mouth every 6 (six) hours as needed for moderate pain. 11/20/13   Kristen N Ward, DO  permethrin (ELIMITE) 5 % cream Apply to affected area once 04/06/13   Emilia BeckKaitlyn Szekalski, PA-C  predniSONE (DELTASONE) 20 MG tablet Take 2 tablets (40 mg total) by mouth daily. 01/13/14   Birda Didonato, PA-C   BP 111/69  Pulse 99  Temp(Src) 98 F (36.7 C) (Oral)  Resp 18  Ht 5\' 4"  (1.626 m)  Wt 115 lb (52.164 kg)  BMI 19.73 kg/m2  SpO2 100% Physical Exam  Nursing note and vitals reviewed. Constitutional: She is oriented to person, place, and time. She appears well-developed and well-nourished. No distress.  HENT:  Head: Normocephalic.  Mouth/Throat: Oropharynx is clear and moist.  Eyes: Conjunctivae and EOM are normal. Pupils are  equal, round, and reactive to light.  Neck: Normal range of motion.  Cardiovascular: Normal rate, regular rhythm and intact distal pulses.   Pulmonary/Chest: Effort normal and breath sounds normal. No stridor. No respiratory distress. She has no wheezes. She has no rales. She exhibits no tenderness.  Abdominal: Soft. Bowel sounds are normal. There is no tenderness.  Musculoskeletal: Normal range of motion.  Neurological: She is alert and oriented to person, place, and time.  Skin: Rash noted.  Excoriated welts to distal upper and lower  extremities and along the anterior abdominal midline.  No induration, warmth, discharge, tenderness to palpation  Psychiatric: She has a normal mood and affect.    ED Course  Procedures (including critical care time) Labs Review Labs Reviewed  CBC WITH DIFFERENTIAL    Imaging Review No results found.   EKG Interpretation None      MDM   Final diagnoses:  Bed bug bite  Myalgia    Filed Vitals:   01/13/14 1319  BP: 111/69  Pulse: 99  Temp: 98 F (36.7 C)  TempSrc: Oral  Resp: 18  Height: 5\' 4"  (1.626 m)  Weight: 115 lb (52.164 kg)  SpO2: 100%    Medications  predniSONE (DELTASONE) tablet 60 mg (60 mg Oral Given 01/13/14 1341)  hydrOXYzine (ATARAX/VISTARIL) tablet 25 mg (25 mg Oral Given 01/13/14 1341)    ADISEN BENNION is a 18 y.o. female presenting with pruritic insect bites to the areas that would've been exposed during sleep. Advise patient that she will need to inform her landlord and recommended professional extermination. Patient also reports subjective fever chills and myalgia. Patient with normal vital signs and no cough, rhinorrhea, abdominal pain, dysuria. Physical exam is not consistent with a cellulitis. Will obtain CBC.  CBC with no leukocytosis. We'll DC home with symptomatic relief. Advised patient if she is still infected with bed bugs she will continue to itch.  Evaluation does not show pathology that would require ongoing emergent intervention or inpatient treatment. Pt is hemodynamically stable and mentating appropriately. Discussed findings and plan with patient/guardian, who agrees with care plan. All questions answered. Return precautions discussed and outpatient follow up given.   New Prescriptions   HYDROXYZINE (ATARAX/VISTARIL) 25 MG TABLET    Take 1 tablet (25 mg total) by mouth every 6 (six) hours.   PREDNISONE (DELTASONE) 20 MG TABLET    Take 2 tablets (40 mg total) by mouth daily.         Wynetta Emery, PA-C 01/13/14 1410

## 2014-03-15 ENCOUNTER — Emergency Department (HOSPITAL_BASED_OUTPATIENT_CLINIC_OR_DEPARTMENT_OTHER)
Admission: EM | Admit: 2014-03-15 | Discharge: 2014-03-15 | Disposition: A | Discharge status: Home / Self Care | Payer: Medicaid Other | Attending: Emergency Medicine | Admitting: Emergency Medicine

## 2014-03-15 ENCOUNTER — Encounter (HOSPITAL_BASED_OUTPATIENT_CLINIC_OR_DEPARTMENT_OTHER): Payer: Self-pay | Admitting: Emergency Medicine

## 2014-03-15 DIAGNOSIS — Z79899 Other long term (current) drug therapy: Secondary | ICD-10-CM | POA: Insufficient documentation

## 2014-03-15 DIAGNOSIS — F17200 Nicotine dependence, unspecified, uncomplicated: Secondary | ICD-10-CM | POA: Insufficient documentation

## 2014-03-15 DIAGNOSIS — R300 Dysuria: Secondary | ICD-10-CM | POA: Insufficient documentation

## 2014-03-15 DIAGNOSIS — IMO0002 Reserved for concepts with insufficient information to code with codable children: Secondary | ICD-10-CM | POA: Insufficient documentation

## 2014-03-15 DIAGNOSIS — N390 Urinary tract infection, site not specified: Secondary | ICD-10-CM

## 2014-03-15 DIAGNOSIS — Z792 Long term (current) use of antibiotics: Secondary | ICD-10-CM | POA: Insufficient documentation

## 2014-03-15 DIAGNOSIS — Z3202 Encounter for pregnancy test, result negative: Secondary | ICD-10-CM | POA: Insufficient documentation

## 2014-03-15 LAB — URINALYSIS, ROUTINE W REFLEX MICROSCOPIC
Bilirubin Urine: NEGATIVE
GLUCOSE, UA: NEGATIVE mg/dL
Ketones, ur: NEGATIVE mg/dL
Nitrite: POSITIVE — AB
PH: 5.5 (ref 5.0–8.0)
Protein, ur: NEGATIVE mg/dL
Specific Gravity, Urine: 1.022 (ref 1.005–1.030)
Urobilinogen, UA: 0.2 mg/dL (ref 0.0–1.0)

## 2014-03-15 LAB — URINE MICROSCOPIC-ADD ON

## 2014-03-15 LAB — PREGNANCY, URINE: PREG TEST UR: NEGATIVE

## 2014-03-15 MED ORDER — LIDOCAINE HCL (PF) 1 % IJ SOLN
INTRAMUSCULAR | Status: AC
  Filled 2014-03-15: qty 5

## 2014-03-15 MED ORDER — CEFTRIAXONE SODIUM 1 G IJ SOLR
1.0000 g | Freq: Once | INTRAMUSCULAR | Status: AC
  Administered 2014-03-15: 1 g via INTRAMUSCULAR
  Filled 2014-03-15: qty 10

## 2014-03-15 MED ORDER — OXYCODONE-ACETAMINOPHEN 5-325 MG PO TABS
1.0000 | ORAL_TABLET | Freq: Once | ORAL | Status: AC
  Administered 2014-03-15: 1 via ORAL
  Filled 2014-03-15: qty 1

## 2014-03-15 NOTE — Discharge Instructions (Signed)

## 2014-03-15 NOTE — ED Provider Notes (Signed)
CSN: [id number]     Arrival date & time 03/15/14  1940 History  This chart was scribed for Rolan Bucco, MD by Luisa Dago, ED Scribe. This patient was seen in room MH02/MH02 and the patient's care was started at 8:40 PM.    Chief Complaint  Patient presents with  . Dysuria   The history is provided by the patient. No language interpreter was used.   HPI Comments: Angela Orozco is a 18 y.o. female who presents to the Emergency Department complaining of gradual onset dysuria that started 3 weeks ago. Pt states that she noticed a "knot" about the size of her thumb to the right side of her lower back. She states that the pain is worsened by movement. 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 She states that the knot in her back has been there for a long time. Denies any fever, chills, nausea, vaginal discharge, vaginal bleeding, abdominal pain, congestion, SOB, or chest pain.  Past Medical History  Diagnosis Date  . No pertinent past medical history   . Normal pregnancy 07/04/2011  . SVD (spontaneous vaginal delivery) 07/05/2011   Past Surgical History  Procedure Laterality Date  . Wisdom tooth extraction    . Hemangioma excision     History reviewed. No pertinent family history. History  Substance Use Topics  . Smoking status: Current Every Day Smoker -- 0.50 packs/day    Types: Cigarettes  . Smokeless tobacco: Not on file  . Alcohol Use: No   OB History   Grav Para Term Preterm Abortions TAB SAB Ect Mult Living   Review of Systems  Constitutional: Negative for fever, chills, diaphoresis and fatigue.  HENT: Negative for congestion, rhinorrhea and sneezing.   Eyes: Negative.   Respiratory: Negative for cough, chest tightness and shortness of breath.   Cardiovascular: Negative for chest pain and leg swelling.  Gastrointestinal: Negative for nausea, vomiting, abdominal pain,  diarrhea and blood in stool.  Genitourinary: Positive for dysuria and frequency. Negative for hematuria, vaginal bleeding, vaginal discharge and difficulty urinating.  Musculoskeletal: Positive for back pain. Negative for arthralgias.  Skin: Negative for rash.  Neurological: Negative for dizziness, speech difficulty, weakness, numbness and headaches.      Allergies  Review of patient's allergies indicates no known allergies.  Home Medications   Prior to Admission medications   Medication Sig Start Date End Date Taking? Authorizing Provider  amoxicillin (AMOXIL) 500 MG capsule Take 1 capsule (500 mg total) by mouth 3 (three) times daily. 12/03/13   April K Palumbo-Rasch, MD  cephALEXin (KEFLEX) 500 MG capsule Take 1 capsule (500 mg total) by mouth 2 (two) times daily. 11/20/13   Kristen N Ward, DO  HYDROcodone-acetaminophen (NORCO/VICODIN) 5-325 MG per tablet Take 1-2 tablets by mouth every 4 (four) hours as needed. 12/04/13   Teressa Lower, NP  hydrOXYzine (ATARAX/VISTARIL) 25 MG tablet Take 1 tablet (25 mg total) by mouth every 6 (six) hours. 01/13/14   Nicole Pisciotta, PA-C  ketorolac (TORADOL) 10 MG tablet Take 1 tablet (10 mg total) by mouth every 6 (six) hours as needed for moderate pain. 11/20/13   Kristen N Ward, DO  permethrin (ELIMITE) 5 % cream Apply to affected area once 04/06/13   Emilia Beck, PA-C  predniSONE (DELTASONE) 20 MG tablet Take 2 tablets (40 mg total) by mouth daily. 01/13/14  Nicole Pisciotta, PA-C   Triage Vitals:BP 108/63  Pulse 76  Temp(Src) 98.3 F (36.8 C) (Oral)  Resp 18  Ht  (1.626 m)  Wt 110 lb (49.896 kg)  BMI 18.87 kg/m2  SpO2 100%  Physical Exam  Constitutional: She is oriented to person, place, and time. She appears well-developed and well-nourished.  HENT:  Head: Normocephalic and atraumatic.  Eyes: Pupils are equal, round, and reactive to light.  Neck: Normal range of motion. Neck supple.  Cardiovascular: Normal rate, regular  rhythm and normal heart sounds.   Pulmonary/Chest: Effort normal and breath sounds normal. No respiratory distress. She has no wheezes. She has no rales. She exhibits no tenderness.  Abdominal: Soft. Bowel sounds are normal. There is no tenderness. There is no rebound and no guarding.  1cm cystic feeling knot to right lower back.  No signs of infection.  Mild right CVA tenderness  Musculoskeletal: Normal range of motion. She exhibits no edema.  Lymphadenopathy:    She has no cervical adenopathy.  Neurological: She is alert and oriented to person, place, and time.  Skin: Skin is warm and dry. No rash noted.  Psychiatric: She has a normal mood and affect.    ED Course  Procedures (including critical care time)  DIAGNOSTIC STUDIES: Oxygen Saturation is 100% on RA, normal by my interpretation.    COORDINATION OF CARE: 8:44 PM- Pt advised of plan for treatment and pt agrees.  Results for orders placed during the hospital encounter of 03/15/14  URINALYSIS, ROUTINE W REFLEX MICROSCOPIC      Result Value Ref Range   Color, Urine YELLOW  YELLOW   APPearance CLOUDY (*) CLEAR   Specific Gravity, Urine 1.022  1.005 - 1.030   pH 5.5  5.0 - 8.0   Glucose, UA NEGATIVE  NEGATIVE mg/dL   Hgb urine dipstick SMALL (*) NEGATIVE   Bilirubin Urine NEGATIVE  NEGATIVE   Ketones, ur NEGATIVE  NEGATIVE mg/dL   Protein, ur NEGATIVE  NEGATIVE mg/dL   Urobilinogen, UA 0.2  0.0 - 1.0 mg/dL   Nitrite POSITIVE (*) NEGATIVE   Leukocytes, UA MODERATE (*) NEGATIVE  PREGNANCY, URINE      Result Value Ref Range   Preg Test, Ur NEGATIVE  NEGATIVE  URINE MICROSCOPIC-ADD ON      Result Value Ref Range   Squamous Epithelial / LPF FEW (*) RARE   WBC, UA 11-20  <3 WBC/hpf   RBC / HPF 3-6  <3 RBC/hpf   Bacteria, UA MANY (*) RARE      MDM   Final diagnoses:  UTI (lower urinary tract infection)    Pt with evidence of UTI.  No signs of pyelonephritis.  She states the knot in her back has been there for a  long time. It feels like a small cyst. There is no evidence of infection around the area. She was started on Cipro. She was given a shot of Rocephin in the ED. She was encouraged to followup with her primary care physician if her symptoms are not improving or return here as needed she has worsening of his.  I personally performed the services described in this documentation, which was scribed in my presence. The recorded information has been reviewed and is accurate.    Rolan Bucco, MD 03/15/14 2055

## 2014-03-15 NOTE — ED Notes (Signed)
Pt states  Has knot on each side of lower back which she has had for a while,,  States is having pain in area on rt lower back which is new,  States she has had a bladder infection x 2-3 weeks with painful urination which has not been treated,  Has not had pain w urination x 2 days  Chief com[plaint is rt lower back pain

## 2014-03-15 NOTE — ED Notes (Signed)
Pt c/o painful freq urination x 2 weeks 

## 2014-03-17 ENCOUNTER — Emergency Department (HOSPITAL_COMMUNITY)
Admission: EM | Admit: 2014-03-17 | Discharge: 2014-03-17 | Disposition: A | Discharge status: Home / Self Care | Payer: Medicaid Other | Attending: Emergency Medicine | Admitting: Emergency Medicine

## 2014-03-17 ENCOUNTER — Encounter (HOSPITAL_COMMUNITY): Payer: Self-pay | Admitting: Emergency Medicine

## 2014-03-17 DIAGNOSIS — Z79899 Other long term (current) drug therapy: Secondary | ICD-10-CM | POA: Insufficient documentation

## 2014-03-17 DIAGNOSIS — Z792 Long term (current) use of antibiotics: Secondary | ICD-10-CM | POA: Insufficient documentation

## 2014-03-17 DIAGNOSIS — Z8742 Personal history of other diseases of the female genital tract: Secondary | ICD-10-CM | POA: Insufficient documentation

## 2014-03-17 DIAGNOSIS — R10A1 Flank pain, right side: Secondary | ICD-10-CM

## 2014-03-17 DIAGNOSIS — R109 Unspecified abdominal pain: Secondary | ICD-10-CM | POA: Insufficient documentation

## 2014-03-17 DIAGNOSIS — F17200 Nicotine dependence, unspecified, uncomplicated: Secondary | ICD-10-CM | POA: Insufficient documentation

## 2014-03-17 DIAGNOSIS — IMO0002 Reserved for concepts with insufficient information to code with codable children: Secondary | ICD-10-CM | POA: Insufficient documentation

## 2014-03-17 LAB — URINALYSIS, ROUTINE W REFLEX MICROSCOPIC
BILIRUBIN URINE: NEGATIVE
Glucose, UA: NEGATIVE mg/dL
Hgb urine dipstick: NEGATIVE
Ketones, ur: NEGATIVE mg/dL
NITRITE: NEGATIVE
Protein, ur: NEGATIVE mg/dL
Specific Gravity, Urine: 1.019 (ref 1.005–1.030)
UROBILINOGEN UA: 0.2 mg/dL (ref 0.0–1.0)
pH: 5.5 (ref 5.0–8.0)

## 2014-03-17 LAB — URINE CULTURE: Colony Count: >=100000

## 2014-03-17 LAB — URINE MICROSCOPIC-ADD ON

## 2014-03-17 MED ORDER — NAPROXEN 500 MG PO TABS
500.0000 mg | ORAL_TABLET | Freq: Two times a day (BID) | ORAL | Status: DC
Start: 2014-03-17 — End: 2014-03-17

## 2014-03-17 MED ORDER — CYCLOBENZAPRINE HCL 10 MG PO TABS: 10.0000 mg | ORAL_TABLET | Freq: Two times a day (BID) | ORAL | Status: DC | PRN

## 2014-03-17 MED ORDER — NAPROXEN 500 MG PO TABS: 500.0000 mg | ORAL_TABLET | Freq: Two times a day (BID) | ORAL | Status: DC

## 2014-03-17 NOTE — ED Notes (Signed)
Pt reports R side back pain for the past week and had UTI symptoms. Pt reports she went to [facility] ED and was diagnosed with UTI yesterday. Pt reports pain is the same. No hx of kidney stone. No emesis. Started on abx yesterday

## 2014-03-17 NOTE — Discharge Instructions (Signed)
Musculoskeletal Pain Musculoskeletal pain is muscle and boney aches and pains. These pains can occur in any part of the body. Your caregiver may treat you without knowing the cause of the pain. They may treat you if blood or urine tests, X-rays, and other tests were normal.  CAUSES There is often not a definite cause or reason for these pains. These pains may be caused by a type of germ (virus). The discomfort may also come from overuse. Overuse includes working out too hard when your body is not fit. Boney aches also come from weather changes. Bone is sensitive to atmospheric pressure changes. HOME CARE INSTRUCTIONS   Ask when your test results will be ready. Make sure you get your test results.  Only take over-the-counter or prescription medicines for pain, discomfort, or fever as directed by your caregiver. If you were given medications for your condition, do not drive, operate machinery or power tools, or sign legal documents for 24 hours. Do not drink alcohol. Do not take sleeping pills or other medications that may interfere with treatment.  Continue all activities unless the activities cause more pain. When the pain lessens, slowly resume normal activities. Gradually increase the intensity and duration of the activities or exercise.  During periods of severe pain, bed rest may be helpful. Lay or sit in any position that is comfortable.  Putting ice on the injured area.  Put ice in a bag.  Place a towel between your skin and the bag.  Leave the ice on for 15 to 20 minutes, 3 to 4 times a day.  Follow up with your caregiver for continued problems and no reason can be found for the pain. If the pain becomes worse or does not go away, it may be necessary to repeat tests or do additional testing. Your caregiver may need to look further for a possible cause. SEEK IMMEDIATE MEDICAL CARE IF:  You have pain that is getting worse and is not relieved by medications.  You develop chest pain  that is associated with shortness or breath, sweating, feeling sick to your stomach (nauseous), or throw up (vomit).  Your pain becomes localized to the abdomen.  You develop any new symptoms that seem different or that concern you. MAKE SURE YOU:   Understand these instructions.  Will watch your condition.  Will get help right away if you are not doing well or get worse. Document Released: 06/09/2005 Document Revised: 09/01/2011 Document Reviewed: 02/11/2013 [facility] Patient Information 2015 [place], [place]. This information is not intended to replace advice given to you by your health care provider. Make sure you discuss any questions you have with your health care provider.   CONTINUE WITH THE ANTIBIOTIC THAT WAS STARTED ON YOUR VISIT OF 03/15/14.  FOLLOW-UP WITH YOUR PCP AFTER COMPLETION OF ANTIBIOTIC TO INSURE URINARY TRACT INFECTION HAS CLEARED.

## 2014-03-17 NOTE — ED Provider Notes (Signed)
CSN: [id number]     Arrival date & time 03/17/14  1529 History   First MD Initiated Contact with Patient 03/17/14 1540     Chief Complaint  Patient presents with  . Flank Pain     (Consider location/radiation/quality/duration/timing/severity/associated sxs/prior Treatment) HPI Comments: Patient seen and evaluated at [facility] on Wednesday, 03/15/14.  Presentation and workup consistent with UTI. Given rocephin IM and started on cipro.  Patient presents to ED today complaining of continuing pain to the right flank.  No nausea, vomiting, fever, chills, or abdominal pain.  Patient is a 18 y.o. female presenting with flank pain. The history is provided by the patient and medical records.  Flank Pain This is a new problem. The current episode started in the past 7 days. The problem occurs constantly. The problem has been gradually worsening. Associated symptoms include urinary symptoms. Pertinent negatives include no chills, fever, nausea or vomiting. She has tried rest and oral narcotics for the symptoms. The treatment provided no relief.    Past Medical History  Diagnosis Date  . No pertinent past medical history   . Normal pregnancy 07/04/2011  . SVD (spontaneous vaginal delivery) 07/05/2011   Past Surgical History  Procedure Laterality Date  . Wisdom tooth extraction    . Hemangioma excision     History reviewed. No pertinent family history. History  Substance Use Topics  . Smoking status: Current Every Day Smoker -- 0.50 packs/day    Types: Cigarettes  . Smokeless tobacco: Not on file  . Alcohol Use: No   OB History   Grav Para Term Preterm Abortions TAB SAB Ect Mult Living   Review of Systems  Constitutional: Negative for fever and chills.  Gastrointestinal: Negative for nausea and vomiting.  Genitourinary: Positive for flank pain.  All other systems reviewed and are negative.     Allergies  Review of patient's allergies indicates no known  allergies.  Home Medications   Prior to Admission medications   Medication Sig Start Date End Date Taking? Authorizing Provider  amoxicillin (AMOXIL) 500 MG capsule Take 1 capsule (500 mg total) by mouth 3 (three) times daily. 12/03/13   April K Palumbo-Rasch, MD  cephALEXin (KEFLEX) 500 MG capsule Take 1 capsule (500 mg total) by mouth 2 (two) times daily. 11/20/13   Kristen N Ward, DO  HYDROcodone-acetaminophen (NORCO/VICODIN) 5-325 MG per tablet Take 1-2 tablets by mouth every 4 (four) hours as needed. 12/04/13   Teressa Lower, NP  hydrOXYzine (ATARAX/VISTARIL) 25 MG tablet Take 1 tablet (25 mg total) by mouth every 6 (six) hours. 01/13/14   Nicole Pisciotta, PA-C  ketorolac (TORADOL) 10 MG tablet Take 1 tablet (10 mg total) by mouth every 6 (six) hours as needed for moderate pain. 11/20/13   Kristen N Ward, DO  permethrin (ELIMITE) 5 % cream Apply to affected area once 04/06/13   Emilia Beck, PA-C  predniSONE (DELTASONE) 20 MG tablet Take 2 tablets (40 mg total) by mouth daily. 01/13/14   Nicole Pisciotta, PA-C   BP 107/57  Pulse 73  Temp(Src) 97.7 F (36.5 C) (Oral)  Resp 16  SpO2 99% Physical Exam  Nursing note and vitals reviewed. Constitutional: She is oriented to person, place, and time. She appears well-developed and well-nourished.  HENT:  Head: Normocephalic.  Eyes: Pupils are equal, round, and reactive to light.  Neck: Neck supple.  Cardiovascular: Normal rate and regular rhythm.   Pulmonary/Chest:  Effort normal and breath sounds normal.  Abdominal: Soft. Bowel sounds are normal.  Musculoskeletal: Normal range of motion. She exhibits tenderness. She exhibits no edema.       Back:  Tenderness to right flank area, worse with movement and palpation.  Lymphadenopathy:    She has no cervical adenopathy.  Neurological: She is alert and oriented to person, place, and time.  Skin: Skin is warm and dry.  Psychiatric: She has a normal mood and affect.    ED Course    Procedures (including critical care time) Labs Review Labs Reviewed - No data to display  Imaging Review No results found.   EKG Interpretation None      MDM  Right flank pain consistent with musculoskeletal origin.  Will recheck urine--prior culture result reviewed--e coli, sensitive to cipro. Will start anti-inflammatory and muscle relaxant. Final diagnoses:  None    Right flank pain.    Jimmye Norman, NP 03/17/14 1827

## 2014-03-18 ENCOUNTER — Telehealth (HOSPITAL_BASED_OUTPATIENT_CLINIC_OR_DEPARTMENT_OTHER): Payer: Self-pay | Admitting: Emergency Medicine

## 2014-03-18 NOTE — Telephone Encounter (Deleted)
Post ED Visit - Positive Culture Follow-up: Successful Patient Follow-Up  Culture assessed and recommendations reviewed by:  Wes Dulaney, Pharm.D., BCPS  Celedonio Miyamoto, Pharm.D., BCPS  Georgina Pillion, Pharm.D., BCPS  [place], [place].D., BCPS, AAHIVP  Estella Husk, Pharm.D., BCPS, AAHIVP  Red Christians, Pharm.D.  Tennis Must, Pharm.D.  Positive *** culture   Patient discharged without antimicrobial prescription and treatment is now indicated  Organism is resistant to prescribed ED discharge antimicrobial  Patient with positive blood cultures  Changes discussed with ED provider: *** New antibiotic prescription *** Called to ***  Contacted patient, date ***, time ***   Jiles Harold 03/18/2014, 5:23 PM

## 2014-03-18 NOTE — ED Provider Notes (Signed)
Medical screening examination/treatment/procedure(s) were performed by non-physician practitioner and as supervising physician I was immediately available for consultation/collaboration.   EKG Interpretation None       Kord Monette, MD 03/18/14 1536 

## 2014-03-21 ENCOUNTER — Telehealth (HOSPITAL_COMMUNITY): Payer: Self-pay

## 2014-04-24 ENCOUNTER — Encounter (HOSPITAL_COMMUNITY): Payer: Self-pay | Admitting: Emergency Medicine

## 2014-09-23 ENCOUNTER — Emergency Department (HOSPITAL_COMMUNITY)
Admission: EM | Admit: 2014-09-23 | Discharge: 2014-09-23 | Disposition: A | Discharge status: Home / Self Care | Payer: Medicaid Other | Attending: Emergency Medicine | Admitting: Emergency Medicine

## 2014-09-23 ENCOUNTER — Encounter (HOSPITAL_COMMUNITY): Payer: Self-pay | Admitting: Emergency Medicine

## 2014-09-23 ENCOUNTER — Emergency Department (HOSPITAL_COMMUNITY): Payer: Medicaid Other

## 2014-09-23 DIAGNOSIS — Z8739 Personal history of other diseases of the musculoskeletal system and connective tissue: Secondary | ICD-10-CM | POA: Insufficient documentation

## 2014-09-23 DIAGNOSIS — Z792 Long term (current) use of antibiotics: Secondary | ICD-10-CM | POA: Diagnosis not present

## 2014-09-23 DIAGNOSIS — Z720 Tobacco use: Secondary | ICD-10-CM | POA: Insufficient documentation

## 2014-09-23 DIAGNOSIS — S29092A Other injury of muscle and tendon of back wall of thorax, initial encounter: Secondary | ICD-10-CM | POA: Diagnosis not present

## 2014-09-23 DIAGNOSIS — M25512 Pain in left shoulder: Secondary | ICD-10-CM

## 2014-09-23 DIAGNOSIS — S4992XA Unspecified injury of left shoulder and upper arm, initial encounter: Secondary | ICD-10-CM | POA: Diagnosis not present

## 2014-09-23 DIAGNOSIS — Y998 Other external cause status: Secondary | ICD-10-CM | POA: Insufficient documentation

## 2014-09-23 DIAGNOSIS — Y92410 Unspecified street and highway as the place of occurrence of the external cause: Secondary | ICD-10-CM | POA: Insufficient documentation

## 2014-09-23 DIAGNOSIS — M546 Pain in thoracic spine: Secondary | ICD-10-CM

## 2014-09-23 DIAGNOSIS — Y9389 Activity, other specified: Secondary | ICD-10-CM | POA: Diagnosis not present

## 2014-09-23 DIAGNOSIS — Z79899 Other long term (current) drug therapy: Secondary | ICD-10-CM | POA: Insufficient documentation

## 2014-09-23 HISTORY — DX: Other intervertebral disc degeneration, lumbar region: M51.36

## 2014-09-23 HISTORY — DX: Other intervertebral disc degeneration, lumbar region without mention of lumbar back pain or lower extremity pain: M51.369

## 2014-09-23 MED ORDER — IBUPROFEN 400 MG PO TABS: 400.0000 mg | ORAL_TABLET | Freq: Four times a day (QID) | ORAL | Status: DC | PRN

## 2014-09-23 MED ORDER — METHOCARBAMOL 500 MG PO TABS: 500.0000 mg | ORAL_TABLET | Freq: Two times a day (BID) | ORAL | Status: DC

## 2014-09-23 NOTE — Discharge Instructions (Signed)
Please rest, ice, limit aggravating activities, and use ibuprofen for pain. Please do range of motion activities as tolerated. If pain persists or worsens please follow-up with your primary care provider for further evaluation and management. These use muscle relaxers only as needed, avoid driving, or alcohol use.
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
## 2014-09-23 NOTE — ED Notes (Signed)
PA at bedside Pt alert and oriented x4. Respirations even and unlabored, bilateral symmetrical rise and fall of chest. Skin warm and dry. In no acute distress. Denies needs.   

## 2014-09-23 NOTE — ED Notes (Signed)
Pt back from x-ray.

## 2015-06-04 ENCOUNTER — Inpatient Hospital Stay (HOSPITAL_COMMUNITY): Payer: Medicaid Other

## 2015-06-04 ENCOUNTER — Encounter (HOSPITAL_COMMUNITY): Payer: Self-pay | Admitting: *Deleted

## 2015-06-04 ENCOUNTER — Inpatient Hospital Stay (HOSPITAL_COMMUNITY)
Admission: AD | Admit: 2015-06-04 | Discharge: 2015-06-04 | Disposition: A | Discharge status: Home / Self Care | Payer: Medicaid Other | Source: Ambulatory Visit | Attending: Family Medicine | Admitting: Family Medicine

## 2015-06-04 ENCOUNTER — Emergency Department (HOSPITAL_COMMUNITY)
Admission: EM | Admit: 2015-06-04 | Discharge: 2015-06-04 | Disposition: A | Discharge status: Home / Self Care | Payer: No Typology Code available for payment source

## 2015-06-04 DIAGNOSIS — S01512A Laceration without foreign body of oral cavity, initial encounter: Secondary | ICD-10-CM | POA: Diagnosis not present

## 2015-06-04 DIAGNOSIS — IMO0002 Reserved for concepts with insufficient information to code with codable children: Secondary | ICD-10-CM

## 2015-06-04 DIAGNOSIS — S1181XA Laceration without foreign body of other specified part of neck, initial encounter: Secondary | ICD-10-CM | POA: Diagnosis not present

## 2015-06-04 DIAGNOSIS — O9A313 Physical abuse complicating pregnancy, third trimester: Secondary | ICD-10-CM

## 2015-06-04 DIAGNOSIS — A5901 Trichomonal vulvovaginitis: Secondary | ICD-10-CM

## 2015-06-04 DIAGNOSIS — F1190 Opioid use, unspecified, uncomplicated: Secondary | ICD-10-CM | POA: Diagnosis not present

## 2015-06-04 DIAGNOSIS — O26893 Other specified pregnancy related conditions, third trimester: Secondary | ICD-10-CM | POA: Diagnosis present

## 2015-06-04 DIAGNOSIS — F1490 Cocaine use, unspecified, uncomplicated: Secondary | ICD-10-CM | POA: Diagnosis not present

## 2015-06-04 DIAGNOSIS — O99323 Drug use complicating pregnancy, third trimester: Secondary | ICD-10-CM

## 2015-06-04 DIAGNOSIS — F17210 Nicotine dependence, cigarettes, uncomplicated: Secondary | ICD-10-CM | POA: Diagnosis not present

## 2015-06-04 DIAGNOSIS — S0093XA Contusion of unspecified part of head, initial encounter: Secondary | ICD-10-CM | POA: Insufficient documentation

## 2015-06-04 DIAGNOSIS — Z3A34 34 weeks gestation of pregnancy: Secondary | ICD-10-CM | POA: Insufficient documentation

## 2015-06-04 DIAGNOSIS — O99333 Smoking (tobacco) complicating pregnancy, third trimester: Secondary | ICD-10-CM | POA: Insufficient documentation

## 2015-06-04 DIAGNOSIS — M542 Cervicalgia: Secondary | ICD-10-CM | POA: Diagnosis not present

## 2015-06-04 DIAGNOSIS — S0191XA Laceration without foreign body of unspecified part of head, initial encounter: Secondary | ICD-10-CM | POA: Insufficient documentation

## 2015-06-04 DIAGNOSIS — F1290 Cannabis use, unspecified, uncomplicated: Secondary | ICD-10-CM

## 2015-06-04 DIAGNOSIS — O98313 Other infections with a predominantly sexual mode of transmission complicating pregnancy, third trimester: Secondary | ICD-10-CM | POA: Diagnosis not present

## 2015-06-04 LAB — URINALYSIS, ROUTINE W REFLEX MICROSCOPIC
Bilirubin Urine: NEGATIVE
Glucose, UA: NEGATIVE mg/dL
Hgb urine dipstick: NEGATIVE
Ketones, ur: 15 mg/dL — AB
Nitrite: NEGATIVE
Protein, ur: NEGATIVE mg/dL
Specific Gravity, Urine: 1.02 (ref 1.005–1.030)
pH: 8.5 — ABNORMAL HIGH (ref 5.0–8.0)

## 2015-06-04 LAB — URINE MICROSCOPIC-ADD ON

## 2015-06-04 LAB — RAPID URINE DRUG SCREEN, HOSP PERFORMED
Amphetamines: NOT DETECTED
Barbiturates: NOT DETECTED
Benzodiazepines: NOT DETECTED
Cocaine: POSITIVE — AB
Opiates: POSITIVE — AB
Tetrahydrocannabinol: POSITIVE — AB

## 2015-06-04 MED ORDER — CYCLOBENZAPRINE HCL 5 MG PO TABS
5.0000 mg | ORAL_TABLET | Freq: Once | ORAL | Status: AC
  Administered 2015-06-04: 5 mg via ORAL
  Filled 2015-06-04: qty 1

## 2015-06-04 MED ORDER — PROMETHAZINE HCL 25 MG PO TABS
25.0000 mg | ORAL_TABLET | Freq: Once | ORAL | Status: AC
  Administered 2015-06-04: 25 mg via ORAL
  Filled 2015-06-04: qty 1

## 2015-06-04 MED ORDER — OXYCODONE-ACETAMINOPHEN 5-325 MG PO TABS
1.0000 | ORAL_TABLET | Freq: Once | ORAL | Status: AC
  Administered 2015-06-04: 1 via ORAL
  Filled 2015-06-04: qty 1

## 2015-06-04 MED ORDER — METRONIDAZOLE 500 MG PO TABS
2000.0000 mg | ORAL_TABLET | Freq: Once | ORAL | Status: AC
  Administered 2015-06-04: 2000 mg via ORAL
  Filled 2015-06-04: qty 4
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
## 2015-06-04 NOTE — ED Notes (Signed)
[facility] 
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
## 2015-06-04 NOTE — Discharge Instructions (Signed)
Domestic Violence Information WHAT IS DOMESTIC VIOLENCE?  Domestic violence, also called intimate partner violence, can involve physical, emotional, psychological, sexual, and economic abuse by a current or former intimate partner. Stalking is also considered a type of domestic violence. Domestic violence can happen between people who are or were:   Married.  Dating.  Living together. Abusers repeatedly act to maintain control and power over their partner. Physical abuse can include:  Slapping.   Hitting.   Kicking.   Punching.  Choking.  Pulling the victim's hair.  Damaging the victim's property.  Threatening or hurting the victim with weapons.  Trapping the victim in his or her home.  Forcing the victim to use drugs or alcohol. Emotional and psychological abuse can include:  Threats.  Insults.   Isolation.   Humiliation.  Jealousy and possessiveness.  Blame.  Withholding affection.   Intimidation.   Manipulation.   Limiting contact with friends and family.  Sexual abuse can include:  Forcing sex.   Forcing sexual touching.   Hurting the victim during sex.  Forcing the victim to have sex with other people.  Giving the victim a sexually transmitted disease (STD) on purpose. Economic abuse can include:  Controlling resources, such as money, food, transportation, a phone, or computer.  Stealing money from the victim or his or her family or friends.  Forbidding the victim to work.  Refusing to work or to contribute to the household. Stalking can include:  Making repeated, unwanted phone calls, emails, or text messages.  Leaving cards, letters, flowers or other items the victim does not want.  Watching or following the victim from a distance.  Going to places where the victim does not want the abuser.  Entering the victim's home or car.  Damaging the victim's personal property. WHAT ARE SOME WARNING SIGNS OF DOMESTIC  VIOLENCE?  Physical signs  Bruises.   Broken bones.   Burns or cuts.   Physical pain.   Head injury. Emotional and psychological signs  Crying.   Depression.   Hopelessness.   Desperation.   Trouble sleeping.   Fear of partner.   Anxiety.  Suicidal behavior.  Antisocial behavior.  Low self-esteem.  Fear of intimacy.  Flashbacks. Sexual signs  Bruising, swelling, or bleeding of the genital or rectal area.   Signs of a sexually transmitted infection, such as genital sores, warts, or discharge coming from the genital area.   Pain in the genital area.   Unintended pregnancy.  Problems with pregnancy, including delayed prenatal care and prematurity. Economic signs  Having little money or food.   Homelessness.  Asking for or borrowing money. WHAT ARE COMMON BEHAVIORS OF THOSE AFFECTED BY DOMESTIC VIOLENCE? Those affected by domestic violence may:   Be late to work or other events.   Not show up to places as promised.   Have to let their partner know where they are and who they are with.   Be isolated or kept from seeing friends or family.   Make comments about their partner's temper or behavior.   Make excuses for their partner.   Engage in high-risk sexual behaviors.  Use drugs or alcohol.  Have unhealthy diet-related behaviors. WHAT ARE COMMON FEELINGS OF THOSE AFFECTED BY DOMESTIC VIOLENCE?  Those affected by domestic violence may feel that:   They have to be careful not to say or do things that trigger their partner's anger.   They cannot do anything right.   They deserve to be treated badly.  They are overreacting to their partner's behavior or temper.   They cannot trust their own feelings.   They cannot trust other people.   They are trapped.   Their partner would take away their children.   They are emotionally drained or numb.   Their life is in danger.   They might have to kill  their partner to survive.  WHERE CAN YOU GET HELP?  Domestic violence hotlines and websites If you do not feel safe searching for help online at home, use a computer at [employer] to access [employer]. Call 911 if you are in immediate danger or need medical help.   [employer].  The 24-hour phone hotline is 205-375-1396 or 207-012-4032 (TTY).   The videophone is available Monday through Friday, 9 a.m. to 5 p.m. Call 267-397-8609.  The website is http://thehotline.org  The National Sexual Assault Hotline.  The 24-hour phone hotline is 782-137-9236.  You can access the online hotline at [employer].nl Shelters for victims of domestic violence  If you are a victim of domestic violence, there are resources to help you find a temporary place for you and your children to live (shelter). The specific address of these shelters is often not known to the public.  Police Report assaults, threats, and stalking to the police.  Counselors and counseling centers People who have been victims of domestic violence can benefit from counseling. Counseling can help you cope with difficult emotions and empower you to plan for your future safety. The topics you discuss with a counselor are private and confidential. Children of domestic violence victims also might need counseling to manage stress and anxiety.  The court system You can work with a Clinical research associate or an advocate to get legal protection against an abuser. Protection includes restraining orders and private addresses. Crimes against you, such as assault, can also be prosecuted through the courts. Laws vary by state.   This information is not intended to replace advice given to you by your health care provider. Make sure you discuss any questions you have with your health care provider.   Document Released: 08/30/2003 Document Revised: 06/30/2014 Document Reviewed: 02/24/2014 Elsevier Interactive  Patient Education 2016 [employer]. Trichomoniasis Trichomoniasis is an infection caused by an organism called Trichomonas. The infection can affect both women and men. In women, the outer female genitalia and the vagina are affected. In men, the penis is mainly affected, but the prostate and other reproductive organs can also be involved. Trichomoniasis is a sexually transmitted infection (STI) and is most often passed to another person through sexual contact.  RISK FACTORS  Having unprotected sexual intercourse.  Having sexual intercourse with an infected partner. SIGNS AND SYMPTOMS  Symptoms of trichomoniasis in women include:  Abnormal Killian-green frothy vaginal discharge.  Itching and irritation of the vagina.  Itching and irritation of the area outside the vagina. Symptoms of trichomoniasis in men include:   Penile discharge with or without pain.  Pain during urination. This results from inflammation of the urethra. DIAGNOSIS  Trichomoniasis may be found during a Pap test or physical exam. Your health care provider may use one of the following methods to help diagnose this infection:  Testing the pH of the vagina with a test tape.  Using a vaginal swab test that checks for the Trichomonas organism. A test is available that provides results within a few minutes.  Examining a urine sample.  Testing vaginal secretions. Your health care provider may test you  for other STIs, including HIV. TREATMENT   You may be given medicine to fight the infection. Women should inform their health care provider if they could be or are pregnant. Some medicines used to treat the infection should not be taken during pregnancy.  Your health care provider may recommend over-the-counter medicines or creams to decrease itching or irritation.  Your sexual partner will need to be treated if infected.  Your health care provider may test you for infection again 3 months after treatment. HOME CARE  INSTRUCTIONS   Take medicines only as directed by your health care provider.  Take over-the-counter medicine for itching or irritation as directed by your health care provider.  Do not have sexual intercourse while you have the infection.  Women should not douche or wear tampons while they have the infection.  Discuss your infection with your partner. Your partner may have gotten the infection from you, or you may have gotten it from your partner.  Have your sex partner get examined and treated if necessary.  Practice safe, informed, and protected sex.  See your health care provider for other STI testing. SEEK MEDICAL CARE IF:   You still have symptoms after you finish your medicine.  You develop abdominal pain.  You have pain when you urinate.  You have bleeding after sexual intercourse.  You develop a rash.  Your medicine makes you sick or makes you throw up (vomit). MAKE SURE YOU:  Understand these instructions.  Will watch your condition.  Will get help right away if you are not doing well or get worse.   This information is not intended to replace advice given to you by your health care provider. Make sure you discuss any questions you have with your health care provider.   Document Released: 12/03/2000 Document Revised: 06/30/2014 Document Reviewed: 03/21/2013 Elsevier Interactive Patient Education [employer]2016 Elsevier Inc.

## 2015-06-11 ENCOUNTER — Encounter (HOSPITAL_COMMUNITY): Payer: Self-pay | Admitting: *Deleted

## 2015-06-11 ENCOUNTER — Inpatient Hospital Stay (HOSPITAL_COMMUNITY)
Admission: AD | Admit: 2015-06-11 | Discharge: 2015-06-11 | Discharge status: Against Medical Advice | Payer: Medicaid Other | Source: Ambulatory Visit | Attending: Obstetrics and Gynecology | Admitting: Obstetrics and Gynecology

## 2015-06-11 NOTE — MAU Note (Signed)
Pt G2P1 at 35.3 wks with c/o all over back pain and hip pain since yesterday. Denies any vag bleeding or leaking. States her baby is moving well.Denies any dysuria or fever.

## 2016-01-25 ENCOUNTER — Encounter (HOSPITAL_COMMUNITY): Payer: Self-pay | Admitting: *Deleted

## 2016-01-25 ENCOUNTER — Emergency Department (HOSPITAL_COMMUNITY)
Admission: EM | Admit: 2016-01-25 | Discharge: 2016-01-25 | Disposition: A | Discharge status: Home / Self Care | Payer: Medicaid Other | Attending: Dermatology | Admitting: Dermatology

## 2016-01-25 DIAGNOSIS — Z5321 Procedure and treatment not carried out due to patient leaving prior to being seen by health care provider: Secondary | ICD-10-CM | POA: Insufficient documentation

## 2016-01-25 DIAGNOSIS — M79606 Pain in leg, unspecified: Secondary | ICD-10-CM | POA: Diagnosis not present

## 2016-01-25 NOTE — ED Triage Notes (Signed)
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

## 2016-01-25 NOTE — ED Notes (Signed)
Pt told registration that she was ready to go, Engineer, materials.

## 2016-01-28 ENCOUNTER — Encounter (HOSPITAL_COMMUNITY): Payer: Self-pay | Admitting: Emergency Medicine

## 2016-01-28 ENCOUNTER — Emergency Department (HOSPITAL_COMMUNITY)
Admission: EM | Admit: 2016-01-28 | Discharge: 2016-01-28 | Disposition: A | Discharge status: Home / Self Care | Payer: Medicaid Other | Attending: Emergency Medicine | Admitting: Emergency Medicine

## 2016-01-28 DIAGNOSIS — IMO0002 Reserved for concepts with insufficient information to code with codable children: Secondary | ICD-10-CM

## 2016-01-28 DIAGNOSIS — Y999 Unspecified external cause status: Secondary | ICD-10-CM | POA: Insufficient documentation

## 2016-01-28 DIAGNOSIS — Y929 Unspecified place or not applicable: Secondary | ICD-10-CM | POA: Insufficient documentation

## 2016-01-28 DIAGNOSIS — Y939 Activity, unspecified: Secondary | ICD-10-CM | POA: Insufficient documentation

## 2016-01-28 DIAGNOSIS — F1110 Opioid abuse, uncomplicated: Secondary | ICD-10-CM | POA: Insufficient documentation

## 2016-01-28 DIAGNOSIS — F17210 Nicotine dependence, cigarettes, uncomplicated: Secondary | ICD-10-CM | POA: Insufficient documentation

## 2016-01-28 DIAGNOSIS — F1410 Cocaine abuse, uncomplicated: Secondary | ICD-10-CM | POA: Insufficient documentation

## 2016-01-28 DIAGNOSIS — F1910 Other psychoactive substance abuse, uncomplicated: Secondary | ICD-10-CM
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
## 2016-01-28 NOTE — ED Notes (Signed)
Gave Pt Mal[place]key sandwich and sprite per ok Pisciotta PA
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
## 2016-01-28 NOTE — ED Notes (Signed)
Pt. Left at this time and was accepted to detox facility by POV.

## 2016-01-28 NOTE — ED Notes (Signed)
Pt requests her Father Glory Buff(Travis Shuford) to come to bedside. RN for room notified

## 2016-01-28 NOTE — ED Notes (Signed)
Sheriff at bedside.  

## 2019-02-18 ENCOUNTER — Emergency Department (HOSPITAL_COMMUNITY)
Admission: EM | Admit: 2019-02-18 | Discharge: 2019-02-18 | Disposition: A | Discharge status: Home / Self Care | Payer: Self-pay | Attending: Emergency Medicine | Admitting: Emergency Medicine

## 2019-02-18 ENCOUNTER — Encounter (HOSPITAL_COMMUNITY): Payer: Self-pay | Admitting: Emergency Medicine

## 2019-02-18 ENCOUNTER — Emergency Department (HOSPITAL_COMMUNITY): Payer: Self-pay

## 2019-02-18 ENCOUNTER — Other Ambulatory Visit: Payer: Self-pay

## 2019-02-18 DIAGNOSIS — K029 Dental caries, unspecified: Secondary | ICD-10-CM | POA: Insufficient documentation

## 2019-02-18 DIAGNOSIS — R55 Syncope and collapse: Secondary | ICD-10-CM | POA: Insufficient documentation

## 2019-02-18 DIAGNOSIS — K047 Periapical abscess without sinus: Secondary | ICD-10-CM

## 2019-02-18 DIAGNOSIS — F17210 Nicotine dependence, cigarettes, uncomplicated: Secondary | ICD-10-CM | POA: Insufficient documentation

## 2019-02-18 LAB — COMPREHENSIVE METABOLIC PANEL
ALT: 19 U/L (ref 0–44)
AST: 13 U/L — ABNORMAL LOW (ref 15–41)
Albumin: 4.1 g/dL (ref 3.5–5.0)
Alkaline Phosphatase: 51 U/L (ref 38–126)
Anion gap: 10 (ref 5–15)
BUN: 8 mg/dL (ref 6–20)
CO2: 25 mmol/L (ref 22–32)
Calcium: 9.6 mg/dL (ref 8.9–10.3)
Chloride: 106 mmol/L (ref 98–111)
Creatinine, Ser: 0.69 mg/dL (ref 0.44–1.00)
GFR calc Af Amer: >60 mL/min (ref >60)
GFR calc non Af Amer: >60 mL/min (ref >60)
Glucose, Bld: 104 mg/dL — ABNORMAL HIGH (ref 70–99)
Potassium: 4.7 mmol/L (ref 3.5–5.1)
Sodium: 141 mmol/L (ref 135–145)
Total Bilirubin: 0.6 mg/dL (ref 0.3–1.2)
Total Protein: 7.2 g/dL (ref 6.5–8.1)

## 2019-02-18 LAB — CBC WITH DIFFERENTIAL/PLATELET
Abs Immature Granulocytes: 0.03 10*3/uL (ref 0.00–0.07)
Basophils Absolute: 0 10*3/uL (ref 0.0–0.1)
Basophils Relative: 0 %
Eosinophils Absolute: 0.1 10*3/uL (ref 0.0–0.5)
Eosinophils Relative: 1 %
HCT: 42.9 % (ref 36.0–46.0)
Hemoglobin: 14.5 g/dL (ref 12.0–15.0)
Immature Granulocytes: 0 %
Lymphocytes Relative: 30 %
Lymphs Abs: 2.7 10*3/uL (ref 0.7–4.0)
MCH: 28.8 pg (ref 26.0–34.0)
MCHC: 33.8 g/dL (ref 30.0–36.0)
MCV: 85.3 fL (ref 80.0–100.0)
Monocytes Absolute: 0.7 10*3/uL (ref 0.1–1.0)
Monocytes Relative: 8 %
Neutro Abs: 5.3 10*3/uL (ref 1.7–7.7)
Neutrophils Relative %: 61 %
Platelets: 355 10*3/uL (ref 150–400)
RBC: 5.03 MIL/uL (ref 3.87–5.11)
RDW: 13.9 % (ref 11.5–15.5)
WBC: 8.9 10*3/uL (ref 4.0–10.5)
nRBC: 0 % (ref 0.0–0.2)

## 2019-02-18 LAB — LACTIC ACID, PLASMA: Lactic Acid, Venous: 1.9 mmol/L (ref 0.5–1.9)

## 2019-02-18 MED ORDER — HYDROXYZINE HCL 25 MG PO TABS: 25.0000 mg | ORAL_TABLET | Freq: Three times a day (TID) | ORAL | 0 refills | Status: AC | PRN

## 2019-02-18 MED ORDER — IBUPROFEN 400 MG PO TABS
400.0000 mg | ORAL_TABLET | Freq: Once | ORAL | Status: AC
  Administered 2019-02-18: 19:00:00 400 mg via ORAL
  Filled 2019-02-18: qty 1

## 2019-02-18 NOTE — ED Triage Notes (Signed)
Pt to ED from the jail with c/o syncopal episode.  Pt st's she has 3 abscess in her mouth Also c/o rapid heartbeat prior to the syncopal episode

## 2019-02-18 NOTE — Discharge Instructions (Addendum)
It was our pleasure to provide your ER care today - we hope that you feel better.  Drink plenty of fluids.   Complete the full course of your antibiotic. Take acetaminophen or ibuprofen as need for pain.   Take vistaril as need for anxiety - no driving when taking.   Follow up with dentist in the next 1-2 weeks.   Return to ER if worse, new symptoms, high fevers, worsening facial, mouth or neck swelling, persistent fast heart beat, fainting, or other concern.
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
## 2019-03-05 ENCOUNTER — Emergency Department (HOSPITAL_COMMUNITY): Payer: Self-pay

## 2019-03-05 ENCOUNTER — Emergency Department (HOSPITAL_COMMUNITY)
Admission: EM | Admit: 2019-03-05 | Discharge: 2019-03-05 | Disposition: A | Discharge status: Home / Self Care | Payer: Self-pay | Attending: Emergency Medicine | Admitting: Emergency Medicine

## 2019-03-05 ENCOUNTER — Encounter (HOSPITAL_COMMUNITY): Payer: Self-pay | Admitting: Emergency Medicine

## 2019-03-05 ENCOUNTER — Encounter (HOSPITAL_COMMUNITY): Payer: Self-pay | Admitting: *Deleted

## 2019-03-05 ENCOUNTER — Other Ambulatory Visit: Payer: Self-pay

## 2019-03-05 ENCOUNTER — Emergency Department (HOSPITAL_COMMUNITY)
Admission: EM | Admit: 2019-03-05 | Discharge: 2019-03-05 | Discharge status: Against Medical Advice | Payer: Self-pay | Attending: Emergency Medicine | Admitting: Emergency Medicine

## 2019-03-05 DIAGNOSIS — R471 Dysarthria and anarthria: Secondary | ICD-10-CM | POA: Insufficient documentation

## 2019-03-05 DIAGNOSIS — T404X1A Poisoning by other synthetic narcotics, accidental (unintentional), initial encounter: Secondary | ICD-10-CM | POA: Insufficient documentation

## 2019-03-05 DIAGNOSIS — S060X1A Concussion with loss of consciousness of 30 minutes or less, initial encounter: Secondary | ICD-10-CM | POA: Insufficient documentation

## 2019-03-05 DIAGNOSIS — R51 Headache: Secondary | ICD-10-CM | POA: Insufficient documentation

## 2019-03-05 DIAGNOSIS — F17210 Nicotine dependence, cigarettes, uncomplicated: Secondary | ICD-10-CM | POA: Insufficient documentation

## 2019-03-05 DIAGNOSIS — Y999 Unspecified external cause status: Secondary | ICD-10-CM | POA: Insufficient documentation

## 2019-03-05 DIAGNOSIS — R531 Weakness: Secondary | ICD-10-CM | POA: Insufficient documentation

## 2019-03-05 DIAGNOSIS — Z5321 Procedure and treatment not carried out due to patient leaving prior to being seen by health care provider: Secondary | ICD-10-CM | POA: Insufficient documentation

## 2019-03-05 DIAGNOSIS — R202 Paresthesia of skin: Secondary | ICD-10-CM | POA: Insufficient documentation

## 2019-03-05 DIAGNOSIS — X58XXXA Exposure to other specified factors, initial encounter: Secondary | ICD-10-CM | POA: Insufficient documentation

## 2019-03-05 DIAGNOSIS — M542 Cervicalgia: Secondary | ICD-10-CM | POA: Insufficient documentation

## 2019-03-05 DIAGNOSIS — Y9289 Other specified places as the place of occurrence of the external cause: Secondary | ICD-10-CM | POA: Insufficient documentation

## 2019-03-05 DIAGNOSIS — Y9389 Activity, other specified: Secondary | ICD-10-CM | POA: Insufficient documentation

## 2019-03-05 DIAGNOSIS — R55 Syncope and collapse: Secondary | ICD-10-CM | POA: Insufficient documentation

## 2019-03-05 LAB — I-STAT BETA HCG BLOOD, ED (MC, WL, AP ONLY): I-stat hCG, quantitative: <5 m[IU]/mL (ref <5)

## 2019-03-05 LAB — CBC
HCT: 40.7 % (ref 36.0–46.0)
Hemoglobin: 13.4 g/dL (ref 12.0–15.0)
MCH: 29.1 pg (ref 26.0–34.0)
MCHC: 32.9 g/dL (ref 30.0–36.0)
MCV: 88.3 fL (ref 80.0–100.0)
Platelets: 365 10*3/uL (ref 150–400)
RBC: 4.61 MIL/uL (ref 3.87–5.11)
RDW: 14.6 % (ref 11.5–15.5)
WBC: 8.7 10*3/uL (ref 4.0–10.5)
nRBC: 0 % (ref 0.0–0.2)

## 2019-03-05 LAB — COMPREHENSIVE METABOLIC PANEL
ALT: 41 U/L (ref 0–44)
AST: 22 U/L (ref 15–41)
Albumin: 4.3 g/dL (ref 3.5–5.0)
Alkaline Phosphatase: 56 U/L (ref 38–126)
Anion gap: 9 (ref 5–15)
BUN: 15 mg/dL (ref 6–20)
CO2: 27 mmol/L (ref 22–32)
Calcium: 9.5 mg/dL (ref 8.9–10.3)
Chloride: 105 mmol/L (ref 98–111)
Creatinine, Ser: 0.69 mg/dL (ref 0.44–1.00)
GFR calc Af Amer: >60 mL/min (ref >60)
GFR calc non Af Amer: >60 mL/min (ref >60)
Glucose, Bld: 111 mg/dL — ABNORMAL HIGH (ref 70–99)
Potassium: 3.7 mmol/L (ref 3.5–5.1)
Sodium: 141 mmol/L (ref 135–145)
Total Bilirubin: 0.6 mg/dL (ref 0.3–1.2)
Total Protein: 7.7 g/dL (ref 6.5–8.1)

## 2019-03-05 LAB — RAPID URINE DRUG SCREEN, HOSP PERFORMED
Amphetamines: POSITIVE — AB
Barbiturates: NOT DETECTED
Benzodiazepines: POSITIVE — AB
Cocaine: NOT DETECTED
Opiates: NOT DETECTED
Tetrahydrocannabinol: NOT DETECTED

## 2019-03-05 LAB — ETHANOL: Alcohol, Ethyl (B): <10 mg/dL (ref <10)
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
## 2019-03-05 NOTE — ED Notes (Signed)
Patient verbalizes understanding of discharge instructions. Opportunity for questioning and answers were provided. Pt discharged from ED. 

## 2019-11-07 ENCOUNTER — Other Ambulatory Visit: Payer: Self-pay

## 2019-11-07 ENCOUNTER — Emergency Department (HOSPITAL_COMMUNITY): Payer: Self-pay

## 2019-11-07 ENCOUNTER — Emergency Department (HOSPITAL_COMMUNITY)
Admission: EM | Admit: 2019-11-07 | Discharge: 2019-11-07 | Disposition: A | Discharge status: Home / Self Care | Payer: Self-pay | Attending: Emergency Medicine | Admitting: Emergency Medicine

## 2019-11-07 DIAGNOSIS — F17210 Nicotine dependence, cigarettes, uncomplicated: Secondary | ICD-10-CM | POA: Insufficient documentation

## 2019-11-07 DIAGNOSIS — R0981 Nasal congestion: Secondary | ICD-10-CM | POA: Insufficient documentation

## 2019-11-07 DIAGNOSIS — J069 Acute upper respiratory infection, unspecified: Secondary | ICD-10-CM | POA: Insufficient documentation

## 2019-11-07 DIAGNOSIS — K029 Dental caries, unspecified: Secondary | ICD-10-CM | POA: Insufficient documentation

## 2019-11-07 DIAGNOSIS — R221 Localized swelling, mass and lump, neck: Secondary | ICD-10-CM | POA: Insufficient documentation

## 2019-11-07 MED ORDER — PENICILLIN V POTASSIUM 500 MG PO TABS: 500.0000 mg | ORAL_TABLET | Freq: Four times a day (QID) | ORAL | 0 refills | Status: DC

## 2019-11-07 MED ORDER — IBUPROFEN 200 MG PO TABS
600.0000 mg | ORAL_TABLET | Freq: Once | ORAL | Status: DC
  Filled 2019-11-07: qty 3

## 2019-11-07 MED ORDER — PENICILLIN V POTASSIUM 500 MG PO TABS: 500.0000 mg | ORAL_TABLET | Freq: Four times a day (QID) | ORAL | 0 refills | Status: AC

## 2019-11-07 NOTE — Discharge Instructions (Addendum)
Your chest x-ray is clear, there is no evidence of a pneumonia.  Your upper respiratory symptoms are likely viral.  Continue treating with symptomatic management.  You can use nasal sprays for congestion, throat lozenges for sore throat.  Drink plenty of water. Take the penicillin, 4 times daily, until gone. You can continue taking ibuprofen every 6 hours for pain. Schedule an appointment with a dentist, using the dental resource guide. Follow-up with your primary care provider symptoms persist. Return the emergency department if you develop high fever, inability to swallow, or new or concerning symptoms.

## 2019-11-07 NOTE — ED Provider Notes (Signed)
[facility]-EMERGENCY DEPT Provider Note   CSN: [id number] Arrival date & time: 11/07/19  0754     History Chief Complaint  Patient presents with  . Cough  . Nasal Congestion  . Oral Swelling    Angela Orozco is a 24 y.o. female presenting to the emergency department with complaint of 4 days of intermittently productive cough, sore throat, and nasal congestion.  Reports some mild associated shortness of breath yesterday.  She states this morning she began having right lower dental pain radiating to her right ear.  Denies associated fevers or chills.  She is treated her symptoms with ibuprofen.  She does not have a dentist.  Patient received her second Covid vaccine in the beginning of this month. The history is provided by the patient.       Past Medical History:  Diagnosis Date  . Degenerative disc disease, lumbar   . No pertinent past medical history   . Normal pregnancy 07/04/2011  . SVD (spontaneous vaginal delivery) 07/05/2011    Patient Active Problem List   Diagnosis Date Noted  . SVD (spontaneous vaginal delivery) 07/05/2011  . WARTS, MULTIPLE 01/11/2008    Past Surgical History:  Procedure Laterality Date  . HEMANGIOMA EXCISION    . WISDOM TOOTH EXTRACTION       OB History    Gravida  2   Para  1   Term  1   Preterm      AB      Living  1     SAB      TAB      Ectopic      Multiple      Live Births  1           No family history on file.  Social History   Tobacco Use  . Smoking status: Current Every Day Smoker    Packs/day: 2.00    Types: Cigarettes  . Smokeless tobacco: Never Used  Substance Use Topics  . Alcohol use: No  . Drug use: Yes    Types: Cocaine    Comment: Heroin    Home Medications Prior to Admission medications   Medication Sig Start Date End Date Taking? Authorizing Provider  hydrOXYzine (ATARAX/VISTARIL) 25 MG tablet Take 1 tablet (25 mg total) by mouth every 8 (eight) hours as  needed. As need for anxiety. 02/18/19   Cathren Laine, MD  penicillin v potassium (VEETID) 500 MG tablet Take 1 tablet (500 mg total) by mouth 4 (four) times daily for 7 days. 11/07/19 11/14/19  Chiquetta Langner, [place] N, PA-C    Allergies    Patient has no known allergies.  Review of Systems   Review of Systems  All other systems reviewed and are negative.   Physical Exam Updated Vital Signs BP 122/76 (BP Location: Left Arm)   Pulse 92   Temp 97.6 F (36.4 C) (Oral)   SpO2 100%   Physical Exam Vitals and nursing note reviewed.  Constitutional:      General: She is not in acute distress.    Appearance: She is well-developed. She is not ill-appearing.  HENT:     Head: Normocephalic and atraumatic.     Right Ear: Tympanic membrane and ear canal normal.     Left Ear: Tympanic membrane and ear canal normal.     Mouth/Throat:     Comments: Poor dentition throughout.  Right lower posterior molars with significant decay and surrounding gingival erythema.  No gingival treatments  or palpable gingival abscess.  Uvula is midline, no trismus.  No sublingual edema or tenderness.  Pharynx is slightly erythematous, no exudates. Eyes:     Conjunctiva/sclera: Conjunctivae normal.  Neck:     Comments: Bilateral anterior cervical adenopathy. Cardiovascular:     Rate and Rhythm: Normal rate and regular rhythm.  Pulmonary:     Effort: Pulmonary effort is normal. No respiratory distress.     Breath sounds: Normal breath sounds.  Abdominal:     Palpations: Abdomen is soft.  Musculoskeletal:     Cervical back: Normal range of motion and neck supple.  Skin:    General: Skin is warm.  Neurological:     Mental Status: She is alert.  Psychiatric:        Behavior: Behavior normal.     ED Results / Procedures / Treatments   Labs (all labs ordered are listed, but only abnormal results are displayed) Labs Reviewed - No data to display  EKG None  Radiology DG Chest 2 View  Result Date:  11/07/2019 CLINICAL DATA:  Cough EXAM: CHEST - 2 VIEW COMPARISON:  February 18, 2019 FINDINGS: Lungs are clear. Heart size and pulmonary vascularity are normal. No adenopathy. No bone lesions. IMPRESSION: Lungs clear.  Cardiac silhouette within normal limits. Electronically Signed   By: Lowella Grip III M.D.   On: 11/07/2019 10:41    Procedures Procedures (including critical care time)  Medications Ordered in ED Medications  ibuprofen (ADVIL) tablet 600 mg (600 mg Oral Refused 11/07/19 1050)    ED Course  I have reviewed the triage vital signs and the nursing notes.  Pertinent labs & imaging results that were available during my care of the patient were reviewed by me and considered in my medical decision making (see chart for details).    MDM Rules/Calculators/A&P                      Patients symptoms are consistent with URI, likely viral etiology.  Patient also with dental infection, no drainable abscess.  No evidence of peritonsillar abscess or Ludwig's angina on exam.  Afebrile, tolerating secretions.  Lungs clear to auscultation bilaterally. CXR negative for acute infiltrate.  Will treat dental infection with penicillin.  Encouraged continue NSAIDs as needed for pain.  Also recommend nasal sprays as needed for congestion and additional over-the-counter symptomatic management.    Verbalizes understanding and is agreeable with plan. Pt is hemodynamically stable & in NAD prior to dc.  Will provide dental resource guide and encouraged she follow closely with a dentist.  Discussed results, findings, treatment and follow up. Patient advised of return precautions. Patient verbalized understanding and agreed with plan.  Final Clinical Impression(s) / ED Diagnoses Final diagnoses:  Pain due to dental caries  Viral URI with cough    Rx / DC Orders ED Discharge Orders         Ordered    penicillin v potassium (VEETID) 500 MG tablet  4 times daily     11/07/19 1051            Dauna Ziska, [place] N, [place] 11/07/19 1107    Charlesetta Shanks, MD 11/08/19 [phone]

## 2019-11-07 NOTE — ED Triage Notes (Signed)
Pt reports for past 4 days had cough, congestion, sore throat, ear pain. reports today woke up with lower tooth swelling. Reports got her 2nd covid shot 10/26/19 unsure if related to her symptoms.

## 2020-06-29 ENCOUNTER — Other Ambulatory Visit: Payer: Self-pay

## 2020-06-29 DIAGNOSIS — Z20822 Contact with and (suspected) exposure to covid-19: Secondary | ICD-10-CM

## 2020-07-03 LAB — NOVEL CORONAVIRUS, NAA: SARS-CoV-2, NAA: DETECTED — AB

## 2021-08-15 ENCOUNTER — Emergency Department (HOSPITAL_COMMUNITY)
Admission: EM | Admit: 2021-08-15 | Discharge: 2021-08-16 | Disposition: A | Discharge status: Home / Self Care | Payer: BLUE CROSS/BLUE SHIELD | Attending: Emergency Medicine | Admitting: Emergency Medicine

## 2021-08-15 DIAGNOSIS — N9489 Other specified conditions associated with female genital organs and menstrual cycle: Secondary | ICD-10-CM | POA: Insufficient documentation

## 2021-08-15 DIAGNOSIS — R112 Nausea with vomiting, unspecified: Secondary | ICD-10-CM | POA: Insufficient documentation

## 2021-08-15 DIAGNOSIS — R1084 Generalized abdominal pain: Secondary | ICD-10-CM | POA: Insufficient documentation

## 2021-08-15 DIAGNOSIS — R197 Diarrhea, unspecified: Secondary | ICD-10-CM | POA: Insufficient documentation

## 2021-08-16 ENCOUNTER — Encounter (HOSPITAL_COMMUNITY): Payer: Self-pay | Admitting: *Deleted

## 2021-08-16 ENCOUNTER — Other Ambulatory Visit: Payer: Self-pay

## 2021-08-16 LAB — URINALYSIS, ROUTINE W REFLEX MICROSCOPIC
Bilirubin Urine: NEGATIVE
Glucose, UA: NEGATIVE mg/dL
Hgb urine dipstick: NEGATIVE
Ketones, ur: NEGATIVE mg/dL
Leukocytes,Ua: NEGATIVE
Nitrite: NEGATIVE
Protein, ur: NEGATIVE mg/dL
Specific Gravity, Urine: 1.023 (ref 1.005–1.030)
pH: 5 (ref 5.0–8.0)

## 2021-08-16 LAB — COMPREHENSIVE METABOLIC PANEL
ALT: 13 U/L (ref 0–44)
AST: 15 U/L (ref 15–41)
Albumin: 4.7 g/dL (ref 3.5–5.0)
Alkaline Phosphatase: 33 U/L — ABNORMAL LOW (ref 38–126)
Anion gap: 6 (ref 5–15)
BUN: 15 mg/dL (ref 6–20)
CO2: 25 mmol/L (ref 22–32)
Calcium: 9 mg/dL (ref 8.9–10.3)
Chloride: 107 mmol/L (ref 98–111)
Creatinine, Ser: 0.78 mg/dL (ref 0.44–1.00)
GFR, Estimated: >60 mL/min (ref >60)
Glucose, Bld: 108 mg/dL — ABNORMAL HIGH (ref 70–99)
Potassium: 3.4 mmol/L — ABNORMAL LOW (ref 3.5–5.1)
Sodium: 138 mmol/L (ref 135–145)
Total Bilirubin: 0.6 mg/dL (ref 0.3–1.2)
Total Protein: 7.7 g/dL (ref 6.5–8.1)

## 2021-08-16 LAB — CBC
HCT: 42.7 % (ref 36.0–46.0)
Hemoglobin: 14.7 g/dL (ref 12.0–15.0)
MCH: 30.9 pg (ref 26.0–34.0)
MCHC: 34.4 g/dL (ref 30.0–36.0)
MCV: 89.7 fL (ref 80.0–100.0)
Platelets: 324 10*3/uL (ref 150–400)
RBC: 4.76 MIL/uL (ref 3.87–5.11)
RDW: 12.6 % (ref 11.5–15.5)
WBC: 8.9 10*3/uL (ref 4.0–10.5)
nRBC: 0 % (ref 0.0–0.2)

## 2021-08-16 LAB — LIPASE, BLOOD: Lipase: 31 U/L (ref 11–51)

## 2021-08-16 LAB — I-STAT BETA HCG BLOOD, ED (MC, WL, AP ONLY): I-stat hCG, quantitative: <5 m[IU]/mL (ref <5)

## 2021-08-16 MED ORDER — ONDANSETRON 4 MG PO TBDP: 4.0000 mg | ORAL_TABLET | Freq: Three times a day (TID) | ORAL | 0 refills | Status: AC | PRN

## 2021-08-16 MED ORDER — KETOROLAC TROMETHAMINE 30 MG/ML IJ SOLN
30.0000 mg | Freq: Once | INTRAMUSCULAR | Status: AC
  Administered 2021-08-16: 30 mg via INTRAVENOUS
  Filled 2021-08-16: qty 1

## 2021-08-16 MED ORDER — SODIUM CHLORIDE 0.9 % IV BOLUS
1000.0000 mL | Freq: Once | INTRAVENOUS | Status: AC
  Administered 2021-08-16: 1000 mL via INTRAVENOUS

## 2021-08-16 MED ORDER — DICYCLOMINE HCL 20 MG PO TABS: 20.0000 mg | ORAL_TABLET | Freq: Two times a day (BID) | ORAL | 0 refills | Status: AC

## 2021-08-16 MED ORDER — ONDANSETRON HCL 4 MG/2ML IJ SOLN
4.0000 mg | Freq: Once | INTRAMUSCULAR | Status: AC
  Administered 2021-08-16: 4 mg via INTRAVENOUS
  Filled 2021-08-16: qty 2

## 2021-08-16 NOTE — ED Notes (Signed)
Labeled urine specimen and culture sent to lab. ENMiles 

## 2021-08-16 NOTE — ED Triage Notes (Signed)
Pt reporting diarrhea and vomiting since around 0400. Headache and lower abdominal cramping.

## 2021-08-16 NOTE — Discharge Instructions (Signed)
Take the prescribed medication as directed.  Make sure to push oral fluids.  Gentle diet for now and progress as tolerated. Follow-up with your primary care doctor. Return to the ED for new or worsening symptoms.

## 2021-08-16 NOTE — ED Provider Notes (Signed)
[facility]-EMERGENCY DEPT Provider Note   CSN: [id number] Arrival date & time: 08/15/21  2340     History  Chief Complaint  Patient presents with   Diarrhea    Angela Orozco is a 26 y.o. female.  The history is provided by the patient and medical records.  Diarrhea Associated symptoms: vomiting    26 year old female presenting to the ED with nausea, vomiting, diarrhea, and generalized abdominal cramping since 4 AM yesterday morning.  States coworker recently diagnosed with norovirus, negative COVID and flu testing.  States since this occurred several other coworkers currently sick with similar.  She has not had any fever but does report some chills and sweats.  She has not had any chest pain or shortness of breath.  She has been taking Tylenol, Tums, and Pepto-Bismol with some mild relief but still having trouble getting fluids down.  Home Medications Prior to Admission medications   Medication Sig Start Date End Date Taking? Authorizing Provider  hydrOXYzine (ATARAX/VISTARIL) 25 MG tablet Take 1 tablet (25 mg total) by mouth every 8 (eight) hours as needed. As need for anxiety. 02/18/19   Cathren Laine, MD      Allergies    Patient has no known allergies.    Review of Systems   Review of Systems  Gastrointestinal:  Positive for diarrhea, nausea and vomiting.  All other systems reviewed and are negative.  Physical Exam Updated Vital Signs BP 116/76    Pulse 66    Temp 98.4 F (36.9 C) (Oral)    Resp 18    Ht 5\' 4"  (1.626 m)    Wt 78 kg    SpO2 100%    BMI 29.52 kg/m   Physical Exam Vitals and nursing note reviewed.  Constitutional:      Appearance: She is well-developed.  HENT:     Head: Normocephalic and atraumatic.  Eyes:     Conjunctiva/sclera: Conjunctivae normal.     Pupils: Pupils are equal, round, and reactive to light.  Cardiovascular:     Rate and Rhythm: Normal rate and regular rhythm.     Heart sounds: Normal heart sounds.   Pulmonary:     Effort: Pulmonary effort is normal. No respiratory distress.     Breath sounds: Normal breath sounds. No rhonchi.  Abdominal:     General: Bowel sounds are normal.     Palpations: Abdomen is soft.  Musculoskeletal:        General: Normal range of motion.     Cervical back: Normal range of motion.  Skin:    General: Skin is warm and dry.  Neurological:     Mental Status: She is alert and oriented to person, place, and time.    ED Results / Procedures / Treatments   Labs (all labs ordered are listed, but only abnormal results are displayed) Labs Reviewed  COMPREHENSIVE METABOLIC PANEL - Abnormal; Notable for the following components:      Result Value   Potassium 3.4 (*)    Glucose, Bld 108 (*)    Alkaline Phosphatase 33 (*)    All other components within normal limits  LIPASE, BLOOD  CBC  URINALYSIS, ROUTINE W REFLEX MICROSCOPIC  I-STAT BETA HCG BLOOD, ED ([facility], [facility], [facility] ONLY)    EKG None  Radiology No results found.  Procedures Procedures    Medications Ordered in ED Medications  sodium chloride 0.9 % bolus 1,000 mL (1,000 mLs Intravenous New Bag/Given 08/16/21 0139)  ondansetron (ZOFRAN) injection 4  mg (4 mg Intravenous Given 08/16/21 0139)  ketorolac (TORADOL) 30 MG/ML injection 30 mg (30 mg Intravenous Given 08/16/21 0140)    ED Course/ Medical Decision Making/ A&P                           Medical Decision Making Amount and/or Complexity of Data Reviewed Labs: ordered. ECG/medicine tests: ordered and independent interpretation performed.  Risk Prescription drug management.   26 year old female presenting to the ED with nausea, vomiting, diarrhea, and generalized abdominal cramping.  Coworker recently with norovirus but had negative COVID and flu testing.  States now several other coworkers currently sick with similar.  She is afebrile and nontoxic in appearance here.  She does appear somewhat pale and dehydrated.  Labs were reviewed and are  overall reassuring without any significant electrolyte imbalance.  No leukocytosis.  Discussed COVID/flu screening and she declined which I think is reasonable.  We will plan for IV fluids, Zofran, Toradol.  Will reassess.  2:47 AM Feeling better after IVF and medications here.  Able to tolerate cup of water and saltines.  No recurrent emesis.  Suspect likely viral illness given sick contacts with same.  Feel she is stable for discharge home and continue symptomatic care.  Work note given. Follow-up with PCP. Can return here for new concerns.  Final diagnoses:  Nausea vomiting and diarrhea    Rx / DC Orders ED Discharge Orders          Ordered    dicyclomine (BENTYL) 20 MG tablet  2 times daily        08/16/21 0249    ondansetron (ZOFRAN-ODT) 4 MG disintegrating tablet  Every 8 hours PRN        08/16/21 0249              Garlon Hatchet, PA-C 08/16/21 0252    Molpus, Jonny Ruiz, MD 08/16/21 [phone]

## 2021-10-03 ENCOUNTER — Emergency Department (HOSPITAL_COMMUNITY)
Admission: EM | Admit: 2021-10-03 | Discharge: 2021-10-03 | Discharge status: Against Medical Advice | Payer: Self-pay | Source: Home / Self Care

## 2021-10-04 ENCOUNTER — Emergency Department (HOSPITAL_BASED_OUTPATIENT_CLINIC_OR_DEPARTMENT_OTHER)
Admission: EM | Admit: 2021-10-04 | Discharge: 2021-10-04 | Disposition: A | Discharge status: Home / Self Care | Payer: Self-pay | Attending: Emergency Medicine | Admitting: Emergency Medicine

## 2021-10-04 ENCOUNTER — Other Ambulatory Visit: Payer: Self-pay

## 2021-10-04 ENCOUNTER — Encounter (HOSPITAL_BASED_OUTPATIENT_CLINIC_OR_DEPARTMENT_OTHER): Payer: Self-pay

## 2021-10-04 ENCOUNTER — Emergency Department (HOSPITAL_BASED_OUTPATIENT_CLINIC_OR_DEPARTMENT_OTHER): Payer: Self-pay

## 2021-10-04 DIAGNOSIS — J209 Acute bronchitis, unspecified: Secondary | ICD-10-CM | POA: Insufficient documentation

## 2021-10-04 DIAGNOSIS — Z20822 Contact with and (suspected) exposure to covid-19: Secondary | ICD-10-CM | POA: Insufficient documentation

## 2021-10-04 LAB — RESP PANEL BY RT-PCR (FLU A&B, COVID) ARPGX2
Influenza A by PCR: NEGATIVE
Influenza B by PCR: NEGATIVE
SARS Coronavirus 2 by RT PCR: NEGATIVE

## 2021-10-04 MED ORDER — ALBUTEROL SULFATE HFA 108 (90 BASE) MCG/ACT IN AERS: 1.0000 | INHALATION_SPRAY | Freq: Four times a day (QID) | RESPIRATORY_TRACT | 0 refills | Status: AC | PRN

## 2021-10-04 MED ORDER — BENZONATATE 100 MG PO CAPS: 100.0000 mg | ORAL_CAPSULE | Freq: Three times a day (TID) | ORAL | 0 refills | Status: AC

## 2021-10-04 MED ORDER — IPRATROPIUM-ALBUTEROL 0.5-2.5 (3) MG/3ML IN SOLN
3.0000 mL | Freq: Once | RESPIRATORY_TRACT | Status: AC
  Administered 2021-10-04: 3 mL via RESPIRATORY_TRACT
  Filled 2021-10-04: qty 3

## 2021-10-04 NOTE — Discharge Instructions (Addendum)
You are negative for COVID, flu and your chest x-ray was negative for pneumonia.  Given your symptoms, I suspect that you have bronchitis which is basically irritation in your airways.  I have sent you home with a medication called Tessalon which she can take to help suppress your cough and you can use an albuterol inhaler to help open your airways and relieve some that irritation.  This could take a couple more weeks to fully resolve.  Continue to drink plenty of water..  Follow-up with your primary doctor if symptoms persist. ?

## 2021-10-04 NOTE — ED Triage Notes (Signed)
Cough x Tuesday. Patient complains of coughing so much it makes her vomit. ?

## 2021-10-05 NOTE — ED Provider Notes (Signed)
?[facility] EMERGENCY DEPARTMENT ?Provider Note ? ? ?CSN: [id number] ?Arrival date & time: 10/04/21  1553 ? ?  ? ?History ? ?Chief Complaint  ?Patient presents with  ? Cough  ? Nasal Congestion  ? ? ?Angela Orozco is a 26 y.o. female who presents to the ED for evaluation of cough with posttussive vomiting that has been ongoing for 2 weeks.  Patient states that she was initially sick for several days, started to feel better and then again became sick again.  All most of her other symptoms have resolved, she continues to have a painful cough that is also posttussive vomiting.  Most recent fever was 2 days ago with Tmax of 102 ?F.  She has been using over-the-counter cough medications without significant improvement.  She endorses chest pain secondary to the cough but denies abdominal pain, nausea, diarrhea.  She has no other complaints. ? ? ?Cough ? ?  ? ?Home Medications ?Prior to Admission medications   ?Medication Sig Start Date End Date Taking? Authorizing Provider  ?albuterol (VENTOLIN HFA) 108 (90 Base) MCG/ACT inhaler Inhale 1-2 puffs into the lungs every 6 (six) hours as needed for wheezing or shortness of breath. 10/04/21  Yes Raynald Blend R, PA-C  ?benzonatate (TESSALON) 100 MG capsule Take 1 capsule (100 mg total) by mouth every 8 (eight) hours. 10/04/21  Yes Raynald Blend R, PA-C  ?dicyclomine (BENTYL) 20 MG tablet Take 1 tablet (20 mg total) by mouth 2 (two) times daily. 08/16/21   Garlon Hatchet, PA-C  ?hydrOXYzine (ATARAX/VISTARIL) 25 MG tablet Take 1 tablet (25 mg total) by mouth every 8 (eight) hours as needed. As need for anxiety. 02/18/19   Cathren Laine, MD  ?ondansetron (ZOFRAN-ODT) 4 MG disintegrating tablet Take 1 tablet (4 mg total) by mouth every 8 (eight) hours as needed for nausea. 08/16/21   Garlon Hatchet, PA-C  ?   ? ?Allergies    ?Patient has no known allergies.   ? ?Review of Systems   ?Review of Systems  ?Respiratory:  Positive for cough.   ? ?Physical Exam ?Updated Vital  Signs ?BP 132/88 (BP Location: Left Arm)   Pulse 75   Temp 98.6 ?F (37 ?C) (Oral)   Resp 16   Ht 5\' 4"  (1.626 m)   Wt 72.6 kg   LMP  (LMP Unknown) Comment: does not get periods  SpO2 98%   BMI 27.46 kg/m?  ?Physical Exam ?Vitals and nursing note reviewed.  ?Constitutional:   ?   General: She is not in acute distress. ?   Appearance: She is not ill-appearing.  ?HENT:  ?   Head: Atraumatic.  ?Eyes:  ?   Conjunctiva/sclera: Conjunctivae normal.  ?Cardiovascular:  ?   Rate and Rhythm: Normal rate and regular rhythm.  ?   Pulses: Normal pulses.  ?   Heart sounds: No murmur heard. ?Pulmonary:  ?   Effort: Pulmonary effort is normal. No respiratory distress.  ?   Breath sounds: Normal breath sounds.  ?Abdominal:  ?   General: Abdomen is flat. There is no distension.  ?   Palpations: Abdomen is soft.  ?   Tenderness: There is no abdominal tenderness.  ?Musculoskeletal:     ?   General: Normal range of motion.  ?   Cervical back: Normal range of motion.  ?Skin: ?   General: Skin is warm and dry.  ?   Capillary Refill: Capillary refill takes less than 2 seconds.  ?Neurological:  ?  General: No focal deficit present.  ?   Mental Status: She is alert.  ?Psychiatric:     ?   Mood and Affect: Mood normal.  ? ? ?ED Results / Procedures / Treatments   ?Labs ?(all labs ordered are listed, but only abnormal results are displayed) ?Labs Reviewed  ?RESP PANEL BY RT-PCR (FLU A&B, COVID) [id number]  ? ? ?EKG ?None ? ?Radiology ?DG Chest 2 View ? ?Result Date: 10/04/2021 ?CLINICAL DATA:  Coughing for 2 weeks. EXAM: CHEST - 2 VIEW COMPARISON:  Chest two views 11/07/2019 FINDINGS: Cardiac silhouette and mediastinal contours are within normal limits. The lungs are clear. No pleural effusion or pneumothorax. No acute skeletal abnormality. IMPRESSION: No active cardiopulmonary disease. Electronically Signed   By: Neita Garnet M.D.   On: 10/04/2021 17:12   ? ?Procedures ?Procedures  ? ?Medications Ordered in ED ?Medications   ?ipratropium-albuterol (DUONEB) 0.5-2.5 (3) MG/3ML nebulizer solution 3 mL (3 mLs Nebulization Given 10/04/21 1739)  ? ? ?ED Course/ Medical Decision Making/ A&P ?  ?                        ?Medical Decision Making ?Amount and/or Complexity of Data Reviewed ?Radiology: ordered. ? ?Risk ?Prescription drug management. ? ? ?ED course:  ? ?26 year old female, in no acute distress, nontoxic-appearing presents to the ED for evaluation of cough x2 weeks.  On exam, her vitals are normal, lungs CTA bilaterally without wheezes rales or rhonchi.  No accessory muscle usage.  I ordered and personally reviewed and interpreted respiratory panel which was negative for COVID and flu.  She was given breathing treatment with some relief.  Patient symptoms consistent with acute bronchitis.  Discussed use of humidifier for symptomatic relief at night along with prescription of albuterol inhaler and Tessalon Perles.  All questions asked and answered.  She is discharged home in stable condition. ?Final Clinical Impression(s) / ED Diagnoses ?Final diagnoses:  ?Acute bronchitis, unspecified organism  ? ? ?Rx / DC Orders ?ED Discharge Orders   ? ?      Ordered  ?  albuterol (VENTOLIN HFA) 108 (90 Base) MCG/ACT inhaler  Every 6 hours PRN       ? 10/04/21 1720  ?  benzonatate (TESSALON) 100 MG capsule  Every 8 hours       ? 10/04/21 1720  ? ?  ?  ? ?  ? ? ?  ?Janell Quiet, [place] ?10/05/21 1026 ? ?  ?Gwyneth Sprout, MD ?10/06/21 0012 ? ?

## 2024-01-14 IMAGING — DX DG CHEST 2V
2 series · 2 of 2 positions shown · non-contrast
Comparison: Chest two views 11/07/2019

CLINICAL DATA: Coughing for 2 weeks.

EXAM:
CHEST - 2 VIEW

[chest pa]
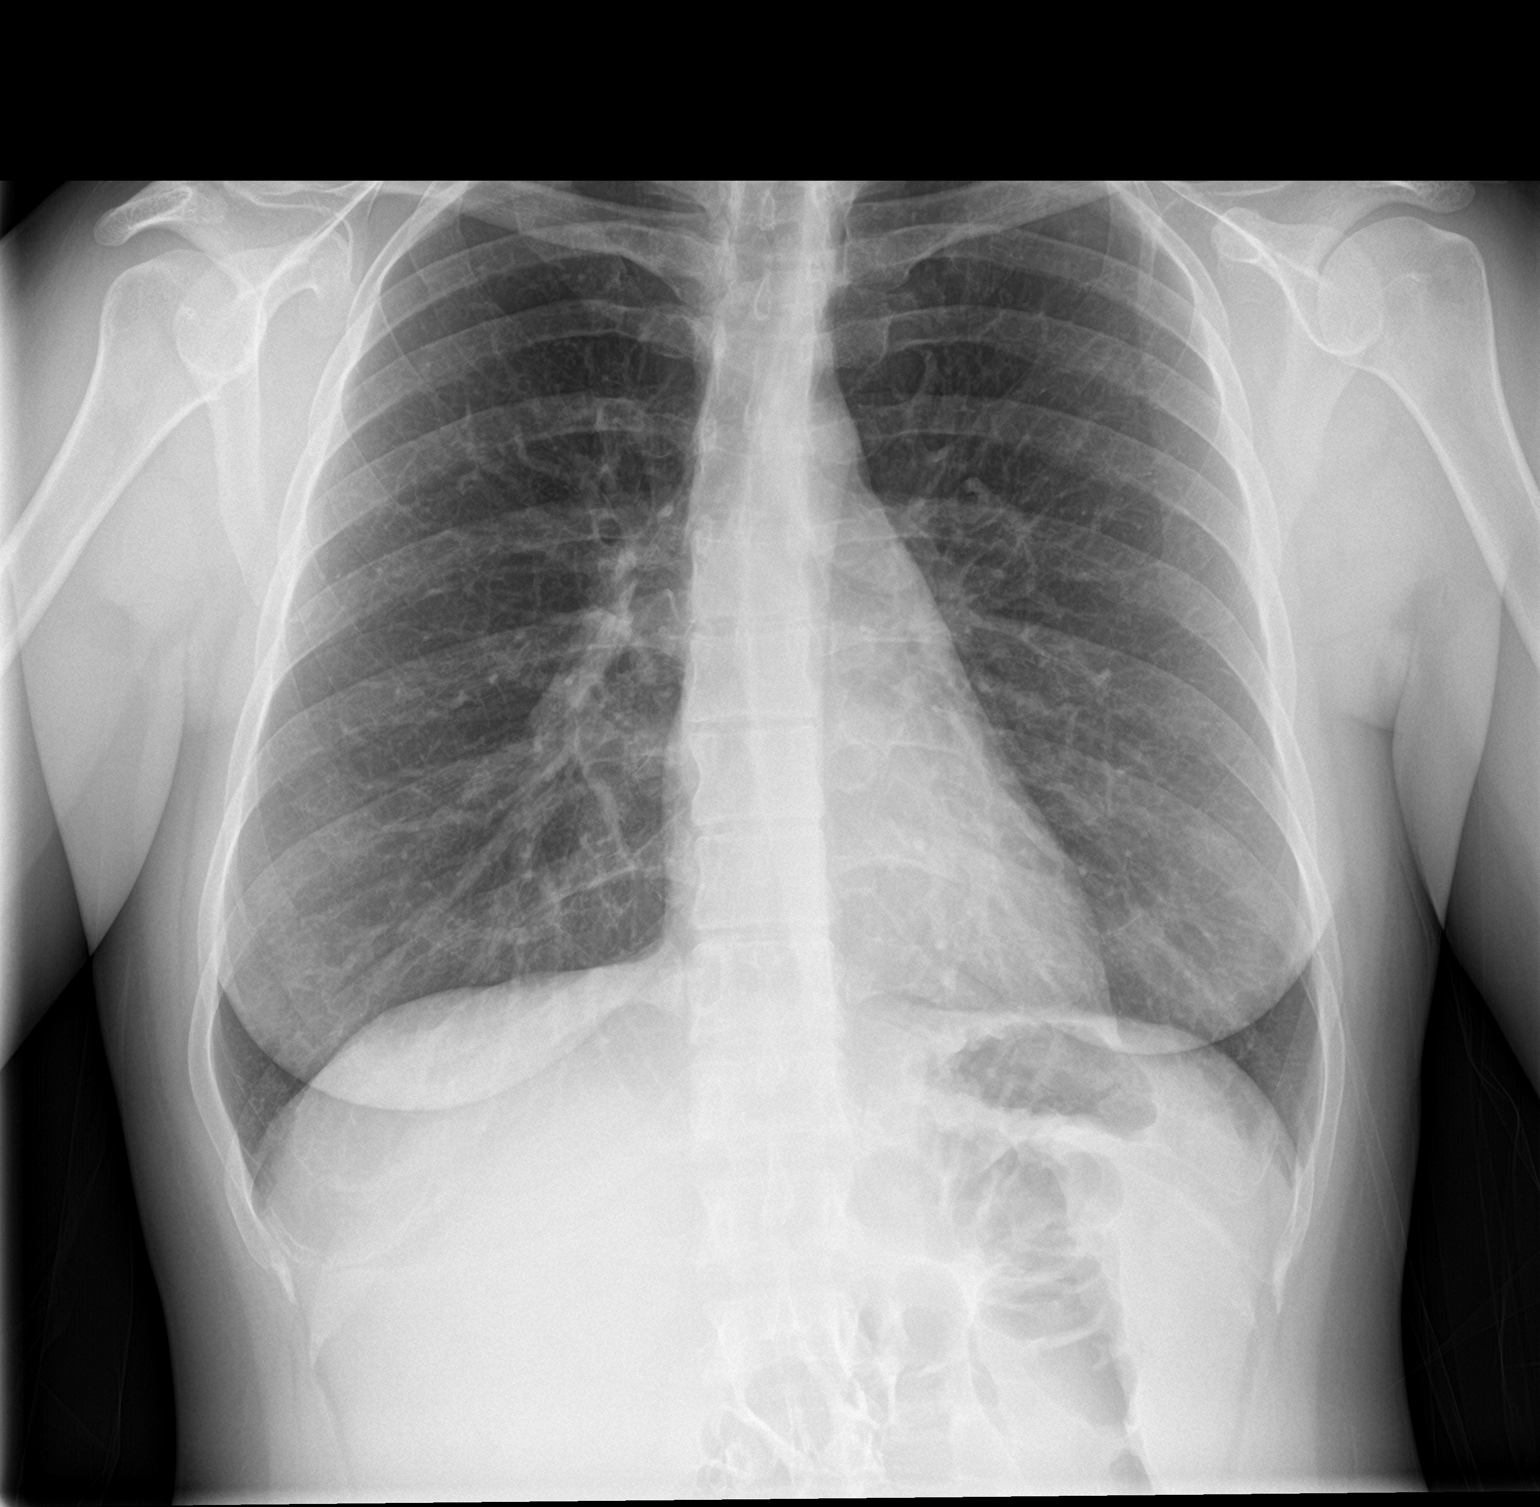

[chest lat]
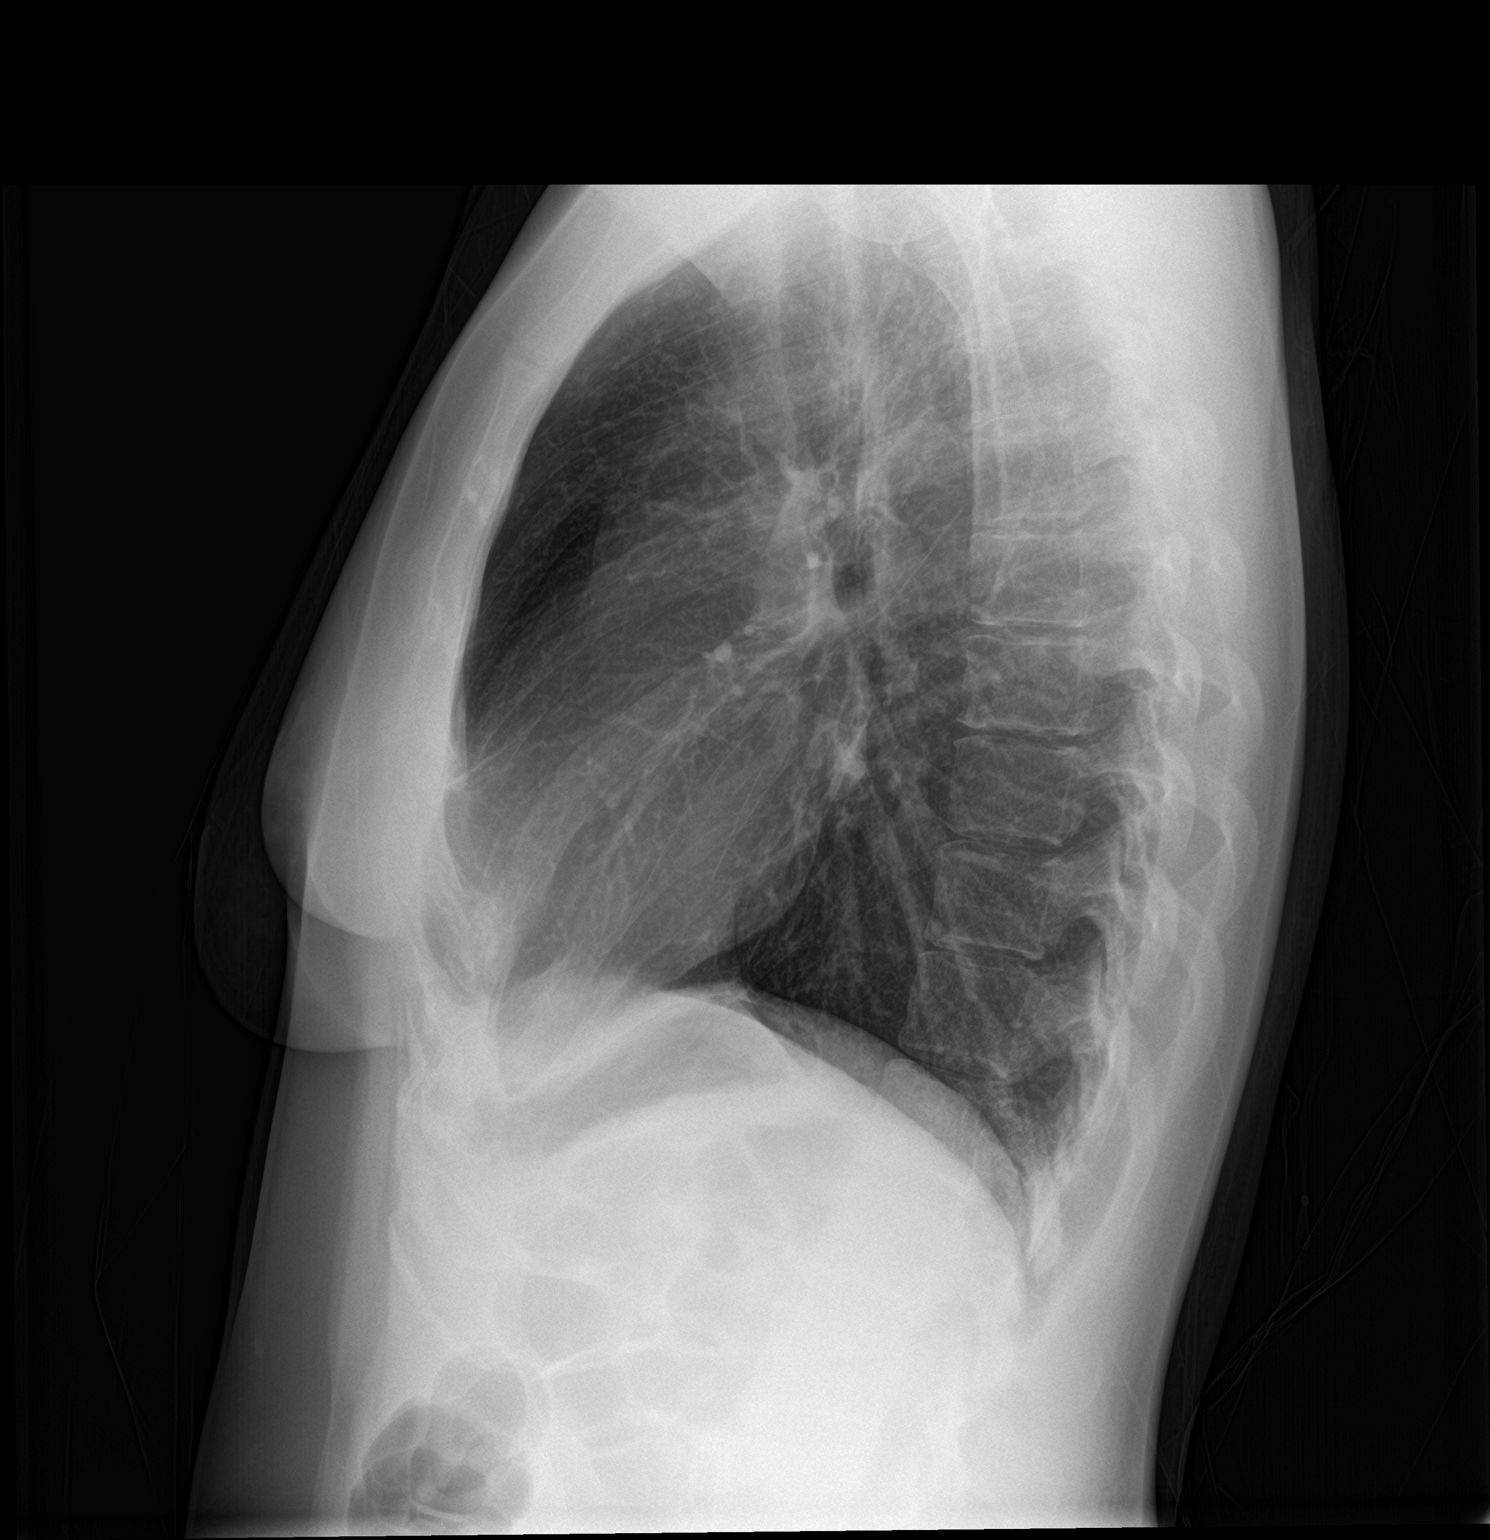

[2 of 2 positions shown; findings below may reference images not displayed]

FINDINGS: Cardiac silhouette and mediastinal contours are within normal
limits. The lungs are clear. No pleural effusion or pneumothorax. No
acute skeletal abnormality.
IMPRESSION: No active cardiopulmonary disease.
